# Patient Record
Sex: Female | Born: 1940 | Race: Black or African American | Hispanic: No | Marital: Single | Smoking: Former smoker
Health system: Southern US, Community
[De-identification: ages and names within clinical notes are randomized; demographics above are authoritative.]

## PROBLEM LIST (undated history)

## (undated) DIAGNOSIS — R000 Tachycardia, unspecified: Secondary | ICD-10-CM

## (undated) DIAGNOSIS — M1990 Unspecified osteoarthritis, unspecified site: Secondary | ICD-10-CM

## (undated) DIAGNOSIS — H269 Unspecified cataract: Secondary | ICD-10-CM

## (undated) DIAGNOSIS — I10 Essential (primary) hypertension: Secondary | ICD-10-CM

## (undated) HISTORY — DX: Unspecified cataract: H26.9

## (undated) HISTORY — PX: ABDOMINAL HYSTERECTOMY: SHX81

## (undated) HISTORY — PX: COLONOSCOPY: SHX174

## (undated) HISTORY — PX: TOE FUSION: SHX1070

## (undated) HISTORY — PX: POLYPECTOMY: SHX149

## (undated) HISTORY — DX: Tachycardia, unspecified: R00.0

## (undated) HISTORY — DX: Unspecified osteoarthritis, unspecified site: M19.90

---

## 1999-11-30 ENCOUNTER — Other Ambulatory Visit: Admission: RE | Admit: 1999-11-30 | Discharge: 1999-11-30 | Payer: Self-pay | Admitting: Obstetrics & Gynecology

## 2002-11-25 ENCOUNTER — Ambulatory Visit: Admission: RE | Admit: 2002-11-25 | Discharge: 2002-11-25 | Payer: Self-pay | Admitting: Internal Medicine

## 2003-11-24 ENCOUNTER — Ambulatory Visit: Payer: Self-pay | Admitting: Nurse Practitioner

## 2003-12-01 ENCOUNTER — Ambulatory Visit (HOSPITAL_COMMUNITY): Admission: RE | Admit: 2003-12-01 | Discharge: 2003-12-01 | Payer: Self-pay | Admitting: Family Medicine

## 2003-12-16 ENCOUNTER — Ambulatory Visit: Payer: Self-pay | Admitting: *Deleted

## 2003-12-29 ENCOUNTER — Ambulatory Visit: Payer: Self-pay | Admitting: Nurse Practitioner

## 2004-09-24 ENCOUNTER — Ambulatory Visit: Payer: Self-pay | Admitting: Internal Medicine

## 2004-09-29 ENCOUNTER — Ambulatory Visit: Payer: Self-pay | Admitting: Internal Medicine

## 2004-10-12 ENCOUNTER — Ambulatory Visit: Payer: Self-pay | Admitting: Gastroenterology

## 2004-10-20 ENCOUNTER — Ambulatory Visit: Payer: Self-pay | Admitting: Gastroenterology

## 2005-01-25 ENCOUNTER — Ambulatory Visit (HOSPITAL_COMMUNITY): Admission: RE | Admit: 2005-01-25 | Discharge: 2005-01-25 | Payer: Self-pay | Admitting: Obstetrics & Gynecology

## 2005-11-24 ENCOUNTER — Ambulatory Visit: Payer: Self-pay | Admitting: Internal Medicine

## 2006-01-11 ENCOUNTER — Emergency Department (HOSPITAL_COMMUNITY): Admission: EM | Admit: 2006-01-11 | Discharge: 2006-01-11 | Payer: Self-pay | Admitting: Emergency Medicine

## 2006-01-30 ENCOUNTER — Ambulatory Visit (HOSPITAL_COMMUNITY): Admission: RE | Admit: 2006-01-30 | Discharge: 2006-01-30 | Payer: Self-pay | Admitting: Internal Medicine

## 2006-04-24 ENCOUNTER — Emergency Department (HOSPITAL_COMMUNITY): Admission: EM | Admit: 2006-04-24 | Discharge: 2006-04-24 | Payer: Self-pay | Admitting: Family Medicine

## 2006-05-15 ENCOUNTER — Ambulatory Visit: Payer: Self-pay | Admitting: Internal Medicine

## 2006-05-15 LAB — CONVERTED CEMR LAB
Cholesterol: 190 mg/dL (ref 0–200)
HDL: 45.3 mg/dL (ref >39.0)
LDL Cholesterol: 128 mg/dL — ABNORMAL HIGH (ref 0–99)
Total CHOL/HDL Ratio: 4.2
Triglycerides: 84 mg/dL (ref 0–149)
VLDL: 17 mg/dL (ref 0–40)

## 2006-08-30 ENCOUNTER — Emergency Department (HOSPITAL_COMMUNITY): Admission: EM | Admit: 2006-08-30 | Discharge: 2006-08-30 | Payer: Self-pay | Admitting: Family Medicine

## 2006-11-27 ENCOUNTER — Ambulatory Visit: Payer: Self-pay | Admitting: Internal Medicine

## 2007-01-06 ENCOUNTER — Encounter: Payer: Self-pay | Admitting: *Deleted

## 2007-01-06 DIAGNOSIS — I10 Essential (primary) hypertension: Secondary | ICD-10-CM | POA: Insufficient documentation

## 2007-01-06 DIAGNOSIS — R002 Palpitations: Secondary | ICD-10-CM | POA: Insufficient documentation

## 2007-01-06 DIAGNOSIS — D126 Benign neoplasm of colon, unspecified: Secondary | ICD-10-CM

## 2007-02-23 ENCOUNTER — Ambulatory Visit (HOSPITAL_COMMUNITY): Admission: RE | Admit: 2007-02-23 | Discharge: 2007-02-23 | Payer: Self-pay | Admitting: Internal Medicine

## 2007-10-29 IMAGING — CR DG CHEST 2V
2 series · 2 of 2 positions shown · non-contrast
Comparison: 01/11/06.

CLINICAL DATA: Cough, nausea.
 PA AND LATERAL CHEST ? 2 VIEW ? 04/24/06:

[view not recorded (1 of 2)]
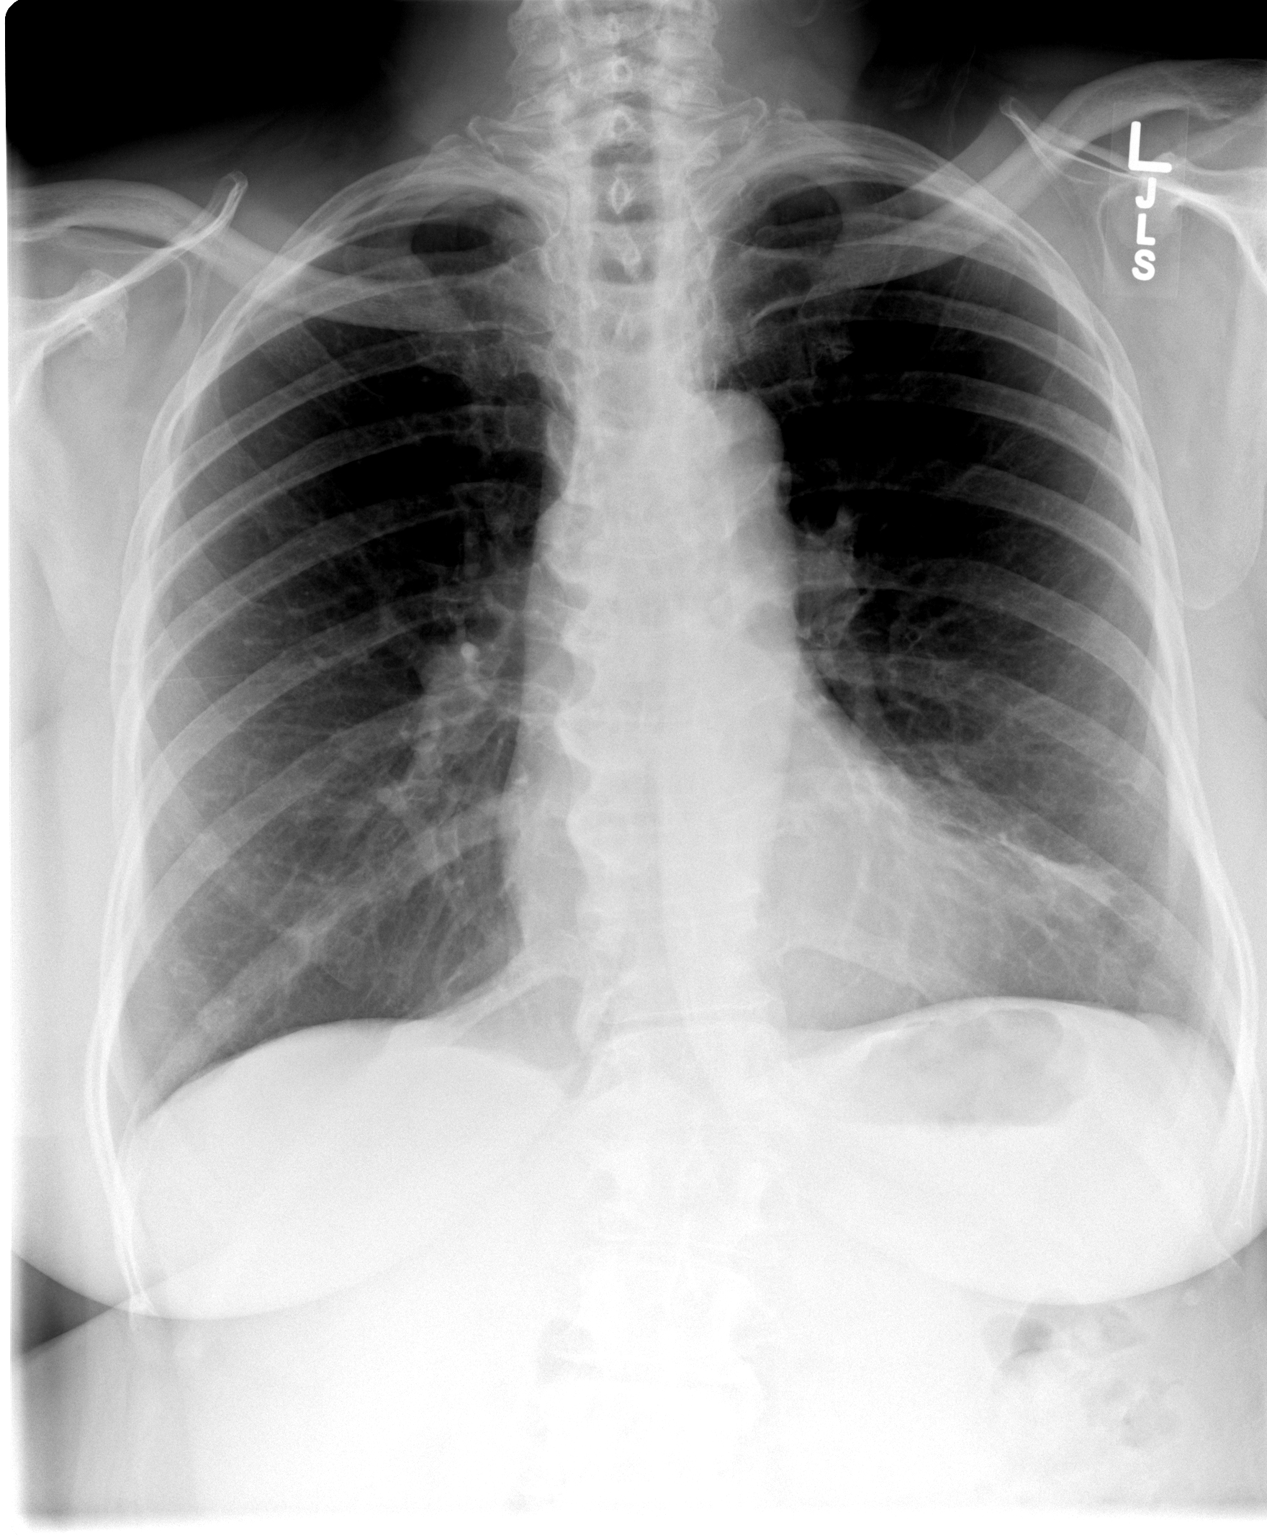

[view not recorded (2 of 2)]
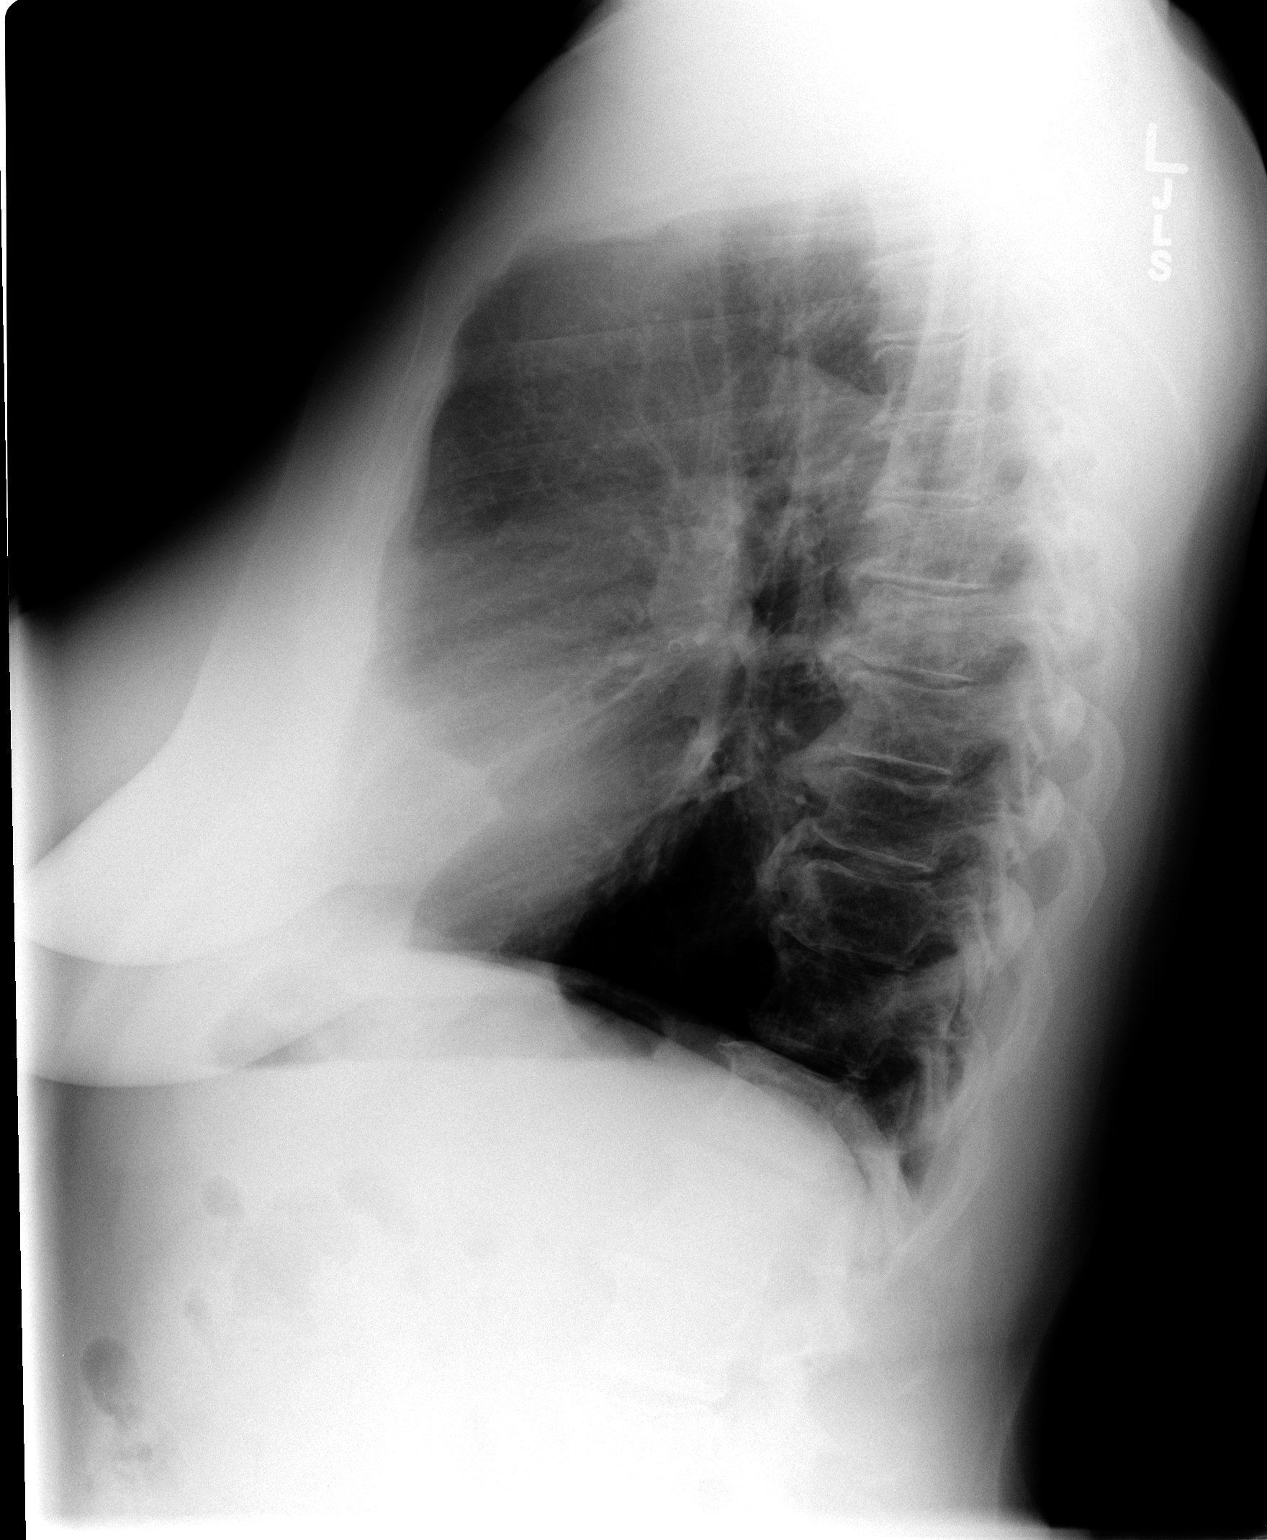

[2 of 2 positions shown; findings below may reference images not displayed]

FINDINGS: There is a new opacity projecting over the left lower lobe, in the approximate area of previous scarring or atelectasis.  Heart size is normal.  The right lung is clear.  No pleural effusion.
IMPRESSION: Pulmonary parenchymal opacity overlying the left lower lobe; given the current clinical history, this could represent developing pneumonia, however, this has a relatively more defined appearance than may be expected for pneumonia, and neoplasm is not excluded.  Follow up to resolution after appropriate treatment in 6-8 weeks is recommended.  If the finding persists at that time, this would be amenable to further evaluation by chest CT.

## 2008-03-11 ENCOUNTER — Ambulatory Visit (HOSPITAL_COMMUNITY): Admission: RE | Admit: 2008-03-11 | Discharge: 2008-03-11 | Payer: Self-pay | Admitting: Internal Medicine

## 2009-03-17 ENCOUNTER — Ambulatory Visit (HOSPITAL_COMMUNITY): Admission: RE | Admit: 2009-03-17 | Discharge: 2009-03-17 | Payer: Self-pay | Admitting: Internal Medicine

## 2009-10-02 ENCOUNTER — Encounter (INDEPENDENT_AMBULATORY_CARE_PROVIDER_SITE_OTHER): Payer: Self-pay | Admitting: *Deleted

## 2009-12-09 ENCOUNTER — Encounter (INDEPENDENT_AMBULATORY_CARE_PROVIDER_SITE_OTHER): Payer: Self-pay | Admitting: *Deleted

## 2009-12-14 ENCOUNTER — Ambulatory Visit: Payer: Self-pay | Admitting: Gastroenterology

## 2010-01-04 ENCOUNTER — Ambulatory Visit: Payer: Self-pay | Admitting: Gastroenterology

## 2010-04-13 NOTE — Letter (Signed)
Summary: Moviprep Instructions  [facility]  [address]   [place], [place] [address]   Phone: [phone]  Fax: [phone]       CATHA ONTKO    [birth year]    MRN: [id number]        Procedure Day /Date: Monday, 01-04-10     Arrival Time: 10:00 a.m.      Procedure Time: 11:00 a.m.     Location of Procedure:                    x  [facility] (4th Floor)                        PREPARATION FOR COLONOSCOPY WITH MOVIPREP   Starting 5 days prior to your procedure 12-30-09 do not eat nuts, seeds, popcorn, corn, beans, peas, salads, or any raw vegetables.  Do not take any fiber supplements (e.g. Metamucil, Citrucel, and Benefiber).  THE DAY BEFORE YOUR PROCEDURE         DATE:  01-03-10 DAY: Sunday  1.  Drink clear liquids the entire day-NO SOLID FOOD  2.  Do not drink anything colored red or purple.  Avoid juices with pulp.  No orange juice.  3.  Drink at least 64 oz. (8 glasses) of fluid/clear liquids during the day to prevent dehydration and help the prep work efficiently.  CLEAR LIQUIDS INCLUDE: Water Jello Ice Popsicles Tea (sugar ok, no milk/cream) Powdered fruit flavored drinks Coffee (sugar ok, no milk/cream) Gatorade Juice: apple, white grape, white cranberry  Lemonade Clear bullion, consomm, broth Carbonated beverages (any kind) Strained chicken noodle soup Hard Candy                             4.  In the morning, mix first dose of MoviPrep solution:    Empty 1 Pouch A and 1 Pouch B into the disposable container    Add lukewarm drinking water to the top line of the container. Mix to dissolve    Refrigerate (mixed solution should be used within 24 hrs)  5.  Begin drinking the prep at 5:00 p.m. The MoviPrep container is divided by 4 marks.   Every 15 minutes drink the solution down to the next mark (approximately 8 oz) until the full liter is complete.   6.  Follow completed prep with 16 oz of clear liquid of your choice  (Nothing red or purple).  Continue to drink clear liquids until bedtime.  7.  Before going to bed, mix second dose of MoviPrep solution:    Empty 1 Pouch A and 1 Pouch B into the disposable container    Add lukewarm drinking water to the top line of the container. Mix to dissolve    Refrigerate  THE DAY OF YOUR PROCEDURE      DATE: 01-04-10 DAY: Monday  Beginning at 6:00 a.m. (5 hours before procedure)         1. Every 15 minutes, drink the solution down to the next mark (approx 8 oz) until the full liter is complete.  2. Follow completed prep with 16 oz. of clear liquid of your choice.    3. You may drink clear liquids until  9:00 a.m. (2 hours before procedure)   MEDICATION INSTRUCTIONS  Unless otherwise instructed, you should take regular prescription medications with a small sip of water   as early as possible the morning of  your procedure.    Additional medication instructions:dO NOT TAKE LISINOPRIL/HCTZ DAY OF PROCEDURE.         OTHER INSTRUCTIONS  You will need a responsible adult at least 71 years of age to accompany you and drive you home.   This person must remain in the waiting room during your procedure.  Wear loose fitting clothing that is easily removed.  Leave jewelry and other valuables at home.  However, you may wish to bring a book to read or  an iPod/MP3 player to listen to music as you wait for your procedure to start.  Remove all body piercing jewelry and leave at home.  Total time from sign-in until discharge is approximately 2-3 hours.  You should go home directly after your procedure and rest.  You can resume normal activities the  day after your procedure.  The day of your procedure you should not:   Drive   Make legal decisions   Operate machinery   Drink alcohol   Return to work  You will receive specific instructions about eating, activities and medications before you leave.    The above instructions have been reviewed and  explained to me by    Ezra Sites RN  October  71, 2011 1:49 PM     I fully understand and can verbalize these instructions _____________________________ Date _________

## 2010-04-13 NOTE — Letter (Signed)
Summary: Colonoscopy Letter  [facility]  [address] [place], [place] [address]   Phone: [phone]  Fax: [phone]      October 02, 2009 MRN: [id number]   AMINAH ZABAWA [address] [place], [place]  [address]   Dear Ms. Daniele,   According to your medical record, it is time for you to schedule a Colonoscopy. The American Cancer Society recommends this procedure as a method to detect early colon cancer. Patients with a family history of colon cancer, or a personal history of colon polyps or inflammatory bowel disease are at increased risk.  This letter has beeen generated based on the recommendations made at the time of your procedure. If you feel that in your particular situation this may no longer apply, please contact our office.  Please call our office at [phone] to schedule this appointment or to update your records at your earliest convenience.  Thank you for cooperating with [place] to provide you with the very best care possible.   Sincerely,    Vania Rea. Jarold Motto, M.D.  [facility] [phone]

## 2010-04-13 NOTE — Miscellaneous (Signed)
Summary: lec pv  Clinical Lists Changes  Medications: Added new medication of MOVIPREP 100 GM  SOLR (PEG-KCL-NACL-NASULF-NA ASC-C) As per prep instructions. - Signed Rx of MOVIPREP 100 GM  SOLR (PEG-KCL-NACL-NASULF-NA ASC-C) As per prep instructions.;  #1 x 0;  Signed;  Entered by: Ezra Sites RN;  Authorized by: Mardella Layman MD [facility];  Method used: Print then Give to Patient Observations: Added new observation of NKA: T (12/14/2009 13:30)    Prescriptions: MOVIPREP 100 GM  SOLR (PEG-KCL-NACL-NASULF-NA ASC-C) As per prep instructions.  #1 x 0   Entered by:   Ezra Sites RN   Authorized by:   Mardella Layman MD [facility]   Signed by:   Ezra Sites RN on 12/14/2009   Method used:   Print then Give to Patient   RxID:   [id number]

## 2010-04-13 NOTE — Procedures (Signed)
Summary: Colonoscopy  Patient: [name] Note: All result statuses are Final unless otherwise noted.  Tests: (1) Colonoscopy (COL)   COL Colonoscopy           DONE     [facility]     [address]     [place], [place]  [address]           COLONOSCOPY PROCEDURE REPORT           PATIENT:  April Tucker, April Tucker  MR#:  [id number]     BIRTHDATE:  [birth year], 69 yrs. old  GENDER:  female     ENDOSCOPIST:  Vania Rea. Jarold Motto, MD, [facility]     REF. BY:     PROCEDURE DATE:  01/04/2010     PROCEDURE:  Diagnostic Colonoscopy     ASA CLASS:  Class II     INDICATIONS:  history of polyps     MEDICATIONS:   Fentanyl 75 mcg IV, Versed 7 mg IV           DESCRIPTION OF PROCEDURE:   After the risks benefits and     alternatives of the procedure were thoroughly explained, informed     consent was obtained.  Digital rectal exam was performed and     revealed no abnormalities.   The LB 180AL [id number] endoscope was     introduced through the anus and advanced to the cecum, which was     identified by both the appendix and ileocecal valve, limited by a     redundant colon.    The quality of the prep was adequate, using     MoviPrep.  The instrument was then slowly withdrawn as the colon     was fully examined.     <<PROCEDUREIMAGES>>           FINDINGS:  Severe diverticulosis was found in the sigmoid to     descending colon segments.  Mild diverticulosis was found in the     right colon.  No polyps or cancers were seen.   Retroflexed views     in the rectum revealed no abnormalities.    The scope was then     withdrawn from the patient and the procedure completed.           COMPLICATIONS:  None     ENDOSCOPIC IMPRESSION:     1) Severe diverticulosis in the sigmoid to descending colon     segments     2) Mild diverticulosis in the right colon     3) No polyps or cancers     RECOMMENDATIONS:     1) high fiber diet     2) Repeat Colonoscopy in 5 years.     REPEAT EXAM:  No        ______________________________     Vania Rea. Jarold Motto, MD, [facility]           CC:  Kellie Shropshire, MD           n.     Rosalie Doctor:   Vania Rea. Patterson at 01/04/2010 11:28 AM           Wyatt Mage, [id number]  Note: An exclamation mark (!) indicates a result that was not dispersed into the flowsheet. Document Creation Date: 01/04/2010 11:28 AM _______________________________________________________________________  (1) Order result status: Final Collection or observation date-time: 01/04/2010 11:19 Requested date-time:  Receipt date-time:  Reported date-time:  Referring Physician:   Ordering Physician: Sheryn Bison [phone]) Specimen Source:  Source: EndoProS  Filler Order Number: [id number] Lab site:   Appended Document: Colonoscopy    Clinical Lists Changes  Observations: Added new observation of COLONNXTDUE: 12/2014 (01/04/2010 13:48)

## 2010-05-21 ENCOUNTER — Other Ambulatory Visit (HOSPITAL_COMMUNITY): Payer: Self-pay | Admitting: Internal Medicine

## 2010-05-21 DIAGNOSIS — Z1231 Encounter for screening mammogram for malignant neoplasm of breast: Secondary | ICD-10-CM

## 2010-06-07 ENCOUNTER — Ambulatory Visit (HOSPITAL_COMMUNITY): Payer: MEDICARE

## 2010-06-15 ENCOUNTER — Ambulatory Visit (HOSPITAL_COMMUNITY)
Admission: RE | Admit: 2010-06-15 | Discharge: 2010-06-15 | Disposition: A | Discharge status: Home / Self Care | Payer: MEDICARE | Source: Ambulatory Visit | Attending: Internal Medicine | Admitting: Internal Medicine

## 2010-06-15 DIAGNOSIS — Z1231 Encounter for screening mammogram for malignant neoplasm of breast: Secondary | ICD-10-CM | POA: Insufficient documentation

## 2010-12-29 LAB — I-STAT 8, (EC8 V) (CONVERTED LAB)
Acid-Base Excess: 2
BUN: 25 — ABNORMAL HIGH
Bicarbonate: 28.6 — ABNORMAL HIGH
Chloride: 104
Glucose, Bld: 91
HCT: 39
Hemoglobin: 13.3
Operator id: 239701
Potassium: 4.5
Sodium: 139
TCO2: 30
pCO2, Ven: 50.5 — ABNORMAL HIGH
pH, Ven: 7.361 — ABNORMAL HIGH

## 2010-12-29 LAB — POCT RAPID STREP A: Streptococcus, Group A Screen (Direct): NEGATIVE

## 2010-12-29 LAB — H. PYLORI ANTIBODY, IGG: H Pylori IgG: 1.4 — ABNORMAL HIGH

## 2011-08-02 ENCOUNTER — Other Ambulatory Visit (HOSPITAL_COMMUNITY): Payer: Self-pay | Admitting: Internal Medicine

## 2011-08-02 DIAGNOSIS — Z1231 Encounter for screening mammogram for malignant neoplasm of breast: Secondary | ICD-10-CM

## 2011-08-23 ENCOUNTER — Ambulatory Visit (HOSPITAL_COMMUNITY)
Admission: RE | Admit: 2011-08-23 | Discharge: 2011-08-23 | Disposition: A | Discharge status: Home / Self Care | Payer: Medicare Other | Source: Ambulatory Visit | Attending: Internal Medicine | Admitting: Internal Medicine

## 2011-08-23 DIAGNOSIS — Z1231 Encounter for screening mammogram for malignant neoplasm of breast: Secondary | ICD-10-CM

## 2012-03-09 ENCOUNTER — Emergency Department (INDEPENDENT_AMBULATORY_CARE_PROVIDER_SITE_OTHER): Payer: Medicare Other

## 2012-03-09 ENCOUNTER — Encounter (HOSPITAL_COMMUNITY): Payer: Self-pay | Admitting: Emergency Medicine

## 2012-03-09 ENCOUNTER — Emergency Department (HOSPITAL_COMMUNITY)
Admission: EM | Admit: 2012-03-09 | Discharge: 2012-03-09 | Disposition: A | Discharge status: Home / Self Care | Payer: Self-pay | Source: Home / Self Care | Attending: Emergency Medicine | Admitting: Emergency Medicine

## 2012-03-09 DIAGNOSIS — S40019A Contusion of unspecified shoulder, initial encounter: Secondary | ICD-10-CM

## 2012-03-09 DIAGNOSIS — W108XXA Fall (on) (from) other stairs and steps, initial encounter: Secondary | ICD-10-CM

## 2012-03-09 DIAGNOSIS — S8000XA Contusion of unspecified knee, initial encounter: Secondary | ICD-10-CM

## 2012-03-09 DIAGNOSIS — S5000XA Contusion of unspecified elbow, initial encounter: Secondary | ICD-10-CM

## 2012-03-09 HISTORY — DX: Essential (primary) hypertension: I10

## 2012-03-09 MED ORDER — HYDROCODONE-ACETAMINOPHEN 5-325 MG PO TABS: 2.0000 | ORAL_TABLET | ORAL | Status: DC | PRN

## 2012-03-09 MED ORDER — MELOXICAM 7.5 MG PO TABS: 7.5000 mg | ORAL_TABLET | Freq: Every day | ORAL | Status: AC

## 2012-03-09 NOTE — ED Notes (Signed)
Pt states that last Thursday she fell down 8 steps. Pt is c/o of left knee pain, bilateral shoulder pain and neck stiffness. Pt states most pain felt in knee upon standing. Pt has used icey hott and rubbing alcohol with mild relief of pain .

## 2012-03-09 NOTE — ED Provider Notes (Signed)
A lot better look a History     CSN: [id number]  Arrival date & time 03/09/12  1247   First MD Initiated Contact with Patient 03/09/12 1445      Chief Complaint  Patient presents with  . Fall    left knee pain, bilateral shoulder pain and neck stiffness    (Consider location/radiation/quality/duration/timing/severity/associated sxs/prior treatment) HPI Comments: Pt states that last Thursday she fell down 8 steps, hitting her Left elbow, and Knee with hurting of her R elbow" he has been sore since then.Marland Kitchen able to walk and to flex and extend her right elbow. " I don't think have anything fracture but my knee is really hurting me when I walk or bend my knee"...  Patient is a 71 y.o. female presenting with fall. The history is provided by the patient.  Fall The accident occurred more than 1 week ago. The fall occurred from a ladder. She fell from a height of 3 to 5 ft. She landed on a hard floor. There was no blood loss. The point of impact was the left shoulder, left knee and right elbow. The pain is at a severity of 6/10. The patient is experiencing no pain. There was no entrapment after the fall. Pertinent negatives include no fever, no abdominal pain, no headaches, no hearing loss, no loss of consciousness and no tingling. The symptoms are aggravated by activity. She has tried nothing for the symptoms. The treatment provided no relief.    Past Medical History  Diagnosis Date  . Hypertension     Past Surgical History  Procedure Date  . Abdominal hysterectomy     History reviewed. No pertinent family history.  History  Substance Use Topics  . Smoking status: Never Smoker   . Smokeless tobacco: Not on file  . Alcohol Use: No    OB History    Grav Para Term Preterm Abortions TAB SAB Ect Mult Living                  Review of Systems  Constitutional: Negative for fever, chills, diaphoresis, activity change, appetite change and fatigue.  Gastrointestinal: Negative for  abdominal pain.  Musculoskeletal: Positive for arthralgias. Negative for myalgias, back pain and gait problem.  Neurological: Negative for tingling, loss of consciousness and headaches.    Allergies  Review of patient's allergies indicates no known allergies.  Home Medications   Current Outpatient Rx  Name  Route  Sig  Dispense  Refill  . LISINOPRIL 2.5 MG PO TABS   Oral   Take 2.5 mg by mouth daily.           BP 153/97  Pulse 77  Temp 97.9 F (36.6 C) (Oral)  Resp 20  SpO2 97%  Physical Exam  Constitutional: Vital signs are normal. She appears well-developed and well-nourished.  Non-toxic appearance. She does not have a sickly appearance. She does not appear ill. No distress.  HENT:  Head: Normocephalic.  Mouth/Throat: No oropharyngeal exudate.  Eyes: Conjunctivae normal are normal.  Musculoskeletal:       Arms:      Legs: Neurological: She is alert.  Skin: No rash noted. No erythema.    ED Course  Procedures (including critical care time)  Labs Reviewed - No data to display Dg Shoulder Left  03/09/2012  *RADIOLOGY REPORT*  Clinical Data: Pain post fall.  LEFT SHOULDER - 2+ VIEW  Comparison: None.  Findings: Marginal spurs from the humeral head and narrowing of the cartilage at the  glenohumeral articulation.  There is some sclerosis and subchondral cysts or geodes also evident portions of the humeral head.  Negative for fracture, dislocation, or other acute bone injury.  IMPRESSION:  1.  Negative for fracture or other acute bony abnormality. 2.  Glenohumeral degenerative changes as above.   Original Report Authenticated By: D. Andria Rhein, MD    Dg Knee Complete 4 Views Left  03/09/2012  *RADIOLOGY REPORT*  Clinical Data: History of injury from fall 8 days previously.  Pain in the anteromedial aspect.  Soft tissue swelling.  LEFT KNEE - COMPLETE 4+ VIEW  Comparison: None.  Findings: No definite joint effusion is evident.  There is narrowing of the patellofemoral  joint space in the medial joint space.  There is marginal osteophyte formation representing degenerative joint osteoarthritic changes.  No fracture or dislocation is evident.  There is osteopenic appearance of bones.  IMPRESSION: Narrowing of patellofemoral joint space and medial joint space. 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  No fracture or dislocation. Osteopenic appearance of bones.   Original Report Authenticated By: Onalee Hua Call      No diagnosis found.    MDM  Multiple contusions. Status post fall 8 days ago. With contusions to her left shoulder left knee and right elbow. Performing x-rays of her knee and shoulder patient has more bony tenderness on exam. Right elbow with full range of motion. No bony tenderness on exam. Patient will be prescribed NSAID  or 10 tablets will.        April Molly, MD 03/09/12 1539

## 2012-09-11 ENCOUNTER — Other Ambulatory Visit (HOSPITAL_COMMUNITY): Payer: Self-pay | Admitting: Internal Medicine

## 2012-09-11 DIAGNOSIS — Z1231 Encounter for screening mammogram for malignant neoplasm of breast: Secondary | ICD-10-CM

## 2012-09-18 ENCOUNTER — Ambulatory Visit (HOSPITAL_COMMUNITY)
Admission: RE | Admit: 2012-09-18 | Discharge: 2012-09-18 | Disposition: A | Discharge status: Home / Self Care | Payer: Medicare Other | Source: Ambulatory Visit | Attending: Internal Medicine | Admitting: Internal Medicine

## 2012-09-18 DIAGNOSIS — Z1231 Encounter for screening mammogram for malignant neoplasm of breast: Secondary | ICD-10-CM | POA: Insufficient documentation

## 2012-12-24 ENCOUNTER — Ambulatory Visit (INDEPENDENT_AMBULATORY_CARE_PROVIDER_SITE_OTHER): Payer: Medicare Other

## 2012-12-24 ENCOUNTER — Encounter: Payer: Self-pay | Admitting: Podiatry

## 2012-12-24 ENCOUNTER — Ambulatory Visit (INDEPENDENT_AMBULATORY_CARE_PROVIDER_SITE_OTHER): Payer: Medicare Other | Admitting: Podiatry

## 2012-12-24 VITALS — BP 139/78 | HR 88 | Resp 16

## 2012-12-24 DIAGNOSIS — Z9889 Other specified postprocedural states: Secondary | ICD-10-CM

## 2012-12-24 DIAGNOSIS — M2040 Other hammer toe(s) (acquired), unspecified foot: Secondary | ICD-10-CM

## 2012-12-24 NOTE — Progress Notes (Signed)
Subjective:     Patient ID: April Tucker, female   DOB: [birth year], 72 y.o.   MRN: [id number]  HPI patient states I'm doing fine and I am able to walk around as much as I feel at this time   Review of Systems  All other systems reviewed and are negative.       Objective:   Physical Exam  Nursing note and vitals reviewed. Cardiovascular: Intact distal pulses.   Musculoskeletal: Normal range of motion.  Neurological: She is alert.  Skin: Skin is warm.   pins are intact second third fourth toes left foot with excellent alignment     Assessment:     Healing well from digital surgery digits 234 left.    Plan:     Pin removal accomplished with sterile dressings applied to the end of the toe. Reviewed x-rays instructed on gradual return to normal shoe gear reappoint 6 weeks

## 2013-02-04 ENCOUNTER — Ambulatory Visit (INDEPENDENT_AMBULATORY_CARE_PROVIDER_SITE_OTHER): Payer: Medicare Other | Admitting: Podiatry

## 2013-02-04 ENCOUNTER — Encounter: Payer: Self-pay | Admitting: Podiatry

## 2013-02-04 ENCOUNTER — Ambulatory Visit (INDEPENDENT_AMBULATORY_CARE_PROVIDER_SITE_OTHER): Payer: Medicare Other

## 2013-02-04 VITALS — BP 138/78 | HR 79 | Resp 12

## 2013-02-04 DIAGNOSIS — Z9889 Other specified postprocedural states: Secondary | ICD-10-CM

## 2013-02-04 DIAGNOSIS — M2040 Other hammer toe(s) (acquired), unspecified foot: Secondary | ICD-10-CM

## 2013-02-04 NOTE — Progress Notes (Signed)
Subjective:     Patient ID: April Tucker, female   DOB: 11-30-1940, 72 y.o.   MRN: [id number]  HPI patient's left second and third toe are found to be in good alignment with no pain when pressed. 3 months post surgery   Review of Systems     Objective:   Physical Exam Neurovascular status intact with good structural alignment second and third toe    Assessment:     Doing well postoperatively    Plan:     Reviewed x-rays and at this time discharged with instructions on plantar flexing and dorsiflexing the second toe for maximum motion

## 2013-09-16 ENCOUNTER — Other Ambulatory Visit (HOSPITAL_COMMUNITY): Payer: Self-pay | Admitting: Internal Medicine

## 2013-09-16 DIAGNOSIS — Z1231 Encounter for screening mammogram for malignant neoplasm of breast: Secondary | ICD-10-CM

## 2013-09-26 ENCOUNTER — Ambulatory Visit (HOSPITAL_COMMUNITY)
Admission: RE | Admit: 2013-09-26 | Discharge: 2013-09-26 | Disposition: A | Discharge status: Home / Self Care | Payer: Medicare Other | Source: Ambulatory Visit | Attending: Internal Medicine | Admitting: Internal Medicine

## 2013-09-26 DIAGNOSIS — Z1231 Encounter for screening mammogram for malignant neoplasm of breast: Secondary | ICD-10-CM

## 2014-04-17 ENCOUNTER — Other Ambulatory Visit: Payer: Self-pay | Admitting: Internal Medicine

## 2014-04-17 DIAGNOSIS — Z13220 Encounter for screening for lipoid disorders: Secondary | ICD-10-CM | POA: Diagnosis not present

## 2014-04-17 DIAGNOSIS — R7301 Impaired fasting glucose: Secondary | ICD-10-CM | POA: Diagnosis not present

## 2014-04-17 DIAGNOSIS — D120 Benign neoplasm of cecum: Secondary | ICD-10-CM | POA: Diagnosis not present

## 2014-04-17 DIAGNOSIS — M810 Age-related osteoporosis without current pathological fracture: Secondary | ICD-10-CM

## 2014-04-17 DIAGNOSIS — I10 Essential (primary) hypertension: Secondary | ICD-10-CM | POA: Diagnosis not present

## 2014-04-17 DIAGNOSIS — M129 Arthropathy, unspecified: Secondary | ICD-10-CM | POA: Diagnosis not present

## 2014-04-28 ENCOUNTER — Other Ambulatory Visit: Payer: Self-pay

## 2014-05-05 ENCOUNTER — Ambulatory Visit
Admission: RE | Admit: 2014-05-05 | Discharge: 2014-05-05 | Disposition: A | Discharge status: Home / Self Care | Payer: Medicare Other | Source: Ambulatory Visit | Attending: Internal Medicine | Admitting: Internal Medicine

## 2014-05-05 DIAGNOSIS — M810 Age-related osteoporosis without current pathological fracture: Secondary | ICD-10-CM

## 2014-05-05 DIAGNOSIS — Z780 Asymptomatic menopausal state: Secondary | ICD-10-CM | POA: Diagnosis not present

## 2014-05-05 DIAGNOSIS — Z13820 Encounter for screening for osteoporosis: Secondary | ICD-10-CM | POA: Diagnosis not present

## 2014-09-26 ENCOUNTER — Other Ambulatory Visit (HOSPITAL_COMMUNITY): Payer: Self-pay | Admitting: Internal Medicine

## 2014-09-26 DIAGNOSIS — Z1231 Encounter for screening mammogram for malignant neoplasm of breast: Secondary | ICD-10-CM

## 2014-10-08 ENCOUNTER — Ambulatory Visit (HOSPITAL_COMMUNITY): Payer: Medicare Other

## 2014-10-15 ENCOUNTER — Ambulatory Visit (HOSPITAL_COMMUNITY)
Admission: RE | Admit: 2014-10-15 | Discharge: 2014-10-15 | Disposition: A | Discharge status: Home / Self Care | Payer: Medicare Other | Source: Ambulatory Visit | Attending: Internal Medicine | Admitting: Internal Medicine

## 2014-10-15 DIAGNOSIS — Z1231 Encounter for screening mammogram for malignant neoplasm of breast: Secondary | ICD-10-CM | POA: Diagnosis present

## 2014-11-09 ENCOUNTER — Encounter: Payer: Self-pay | Admitting: Internal Medicine

## 2014-12-15 ENCOUNTER — Encounter: Payer: Self-pay | Admitting: Internal Medicine

## 2015-01-28 ENCOUNTER — Encounter: Payer: Self-pay | Admitting: Gastroenterology

## 2015-02-02 ENCOUNTER — Ambulatory Visit (AMBULATORY_SURGERY_CENTER): Payer: Self-pay | Admitting: *Deleted

## 2015-02-02 VITALS — Ht 66.0 in | Wt 218.0 lb

## 2015-02-02 DIAGNOSIS — Z86010 Personal history of colonic polyps: Secondary | ICD-10-CM

## 2015-02-02 MED ORDER — NA SULFATE-K SULFATE-MG SULF 17.5-3.13-1.6 GM/177ML PO SOLN: 1.0000 | Freq: Once | ORAL | Status: DC

## 2015-02-02 NOTE — Progress Notes (Signed)
No egg or soy allergy No issues with past sedation No diet pills No home 02 use   No e mail

## 2015-02-03 ENCOUNTER — Encounter: Payer: Self-pay | Admitting: Internal Medicine

## 2015-02-16 ENCOUNTER — Ambulatory Visit (AMBULATORY_SURGERY_CENTER): Payer: Medicare Other | Admitting: Internal Medicine

## 2015-02-16 ENCOUNTER — Encounter: Payer: Self-pay | Admitting: Internal Medicine

## 2015-02-16 VITALS — BP 128/78 | HR 66 | Temp 96.6°F | Resp 14 | Ht 66.0 in | Wt 218.0 lb

## 2015-02-16 DIAGNOSIS — D127 Benign neoplasm of rectosigmoid junction: Secondary | ICD-10-CM

## 2015-02-16 DIAGNOSIS — Z86010 Personal history of colonic polyps: Secondary | ICD-10-CM | POA: Diagnosis not present

## 2015-02-16 DIAGNOSIS — D124 Benign neoplasm of descending colon: Secondary | ICD-10-CM | POA: Diagnosis not present

## 2015-02-16 DIAGNOSIS — D125 Benign neoplasm of sigmoid colon: Secondary | ICD-10-CM | POA: Diagnosis not present

## 2015-02-16 MED ORDER — SODIUM CHLORIDE 0.9 % IV SOLN: 500.0000 mL | INTRAVENOUS | Status: DC

## 2015-02-16 NOTE — Progress Notes (Signed)
Called to room to assist during endoscopic procedure.  Patient ID and intended procedure confirmed with present staff. Received instructions for my participation in the procedure from the performing physician.  

## 2015-02-16 NOTE — Op Note (Signed)
[facility]  [address] [place], [address]   COLONOSCOPY PROCEDURE REPORT  PATIENT: April Tucker, April Tucker  MR#: [id number] BIRTHDATE: November 14, 1940 , 31  yrs. old GENDER: female ENDOSCOPIST: Jerene Bears, MD PROCEDURE DATE:  02/16/2015 PROCEDURE:   Colonoscopy, surveillance and Colonoscopy with snare polypectomy First Screening Colonoscopy - Avg.  risk and is 50 yrs.  old or older - No.  Prior Negative Screening - Now for repeat screening. N/A  History of Adenoma - Now for follow-up colonoscopy & has been > or = to 3 yrs.  Yes hx of adenoma.  Has been 3 or more years since last colonoscopy.  Polyps removed today? Yes ASA CLASS:   Class III INDICATIONS:Surveillance due to prior colonic neoplasia and PH Colon Adenoma. MEDICATIONS: Monitored anesthesia care and Propofol 260 mg IV  DESCRIPTION OF PROCEDURE:   After the risks benefits and alternatives of the procedure were thoroughly explained, informed consent was obtained.  The digital rectal exam revealed no rectal mass.   The LB [id number]  endoscope was introduced through the anus and advanced to the cecum, which was identified by both the appendix and ileocecal valve. No adverse events experienced. The quality of the prep was good.  (Suprep was used)  The instrument was then slowly withdrawn as the colon was fully examined. Estimated blood loss is zero unless otherwise noted in this procedure report.  COLON FINDINGS: A sessile polyp measuring 4 mm in size was found in the descending colon.  A polypectomy was performed with a cold snare.  The resection was complete, the polyp tissue was completely retrieved and sent to histology.   Three sessile polyps ranging between 3-22mm in size were found in the rectosigmoid colon. Polypectomies were performed with a cold snare.  The resection was complete, the polyp tissue was completely retrieved and sent to histology.   There was severe diverticulosis noted in the  ascending colon and left colon.  Retroflexed views revealed small internal hemorrhoids. The time to cecum = 4.8 Withdrawal time = 12.9   The scope was withdrawn and the procedure completed. COMPLICATIONS: There were no immediate complications.  ENDOSCOPIC IMPRESSION: 1.   Sessile polyp was found in the descending colon; polypectomy was performed with a cold snare 2.   Three sessile polyps ranging between 3-62mm in size were found in the rectosigmoid colon; polypectomies were performed with a cold snare 3.   Severe diverticulosis was noted in the ascending colon and left colon  RECOMMENDATIONS: 1.  Await pathology results 2.  High fiber diet 3.  Timing of repeat colonoscopy will be determined by pathology findings. 4.  You will receive a letter within 1-2 weeks with the results of your biopsy as well as final recommendations.  Please call my office if you have not received a letter after 3 weeks.  eSigned:  Jerene Bears, MD 02/16/2015 11:15 AM  cc: Nolene Ebbs, MD and The Patient

## 2015-02-16 NOTE — Patient Instructions (Signed)
Discharge instructions given. Handouts on polyps and diverticulosis. Resume previous medications. YOU HAD AN ENDOSCOPIC PROCEDURE TODAY AT THE [facility]:   Refer to the procedure report that was given to you for any specific questions about what was found during the examination.  If the procedure report does not answer your questions, please call your gastroenterologist to clarify.  If you requested that your care partner not be given the details of your procedure findings, then the procedure report has been included in a sealed envelope for you to review at your convenience later.  YOU SHOULD EXPECT: Some feelings of bloating in the abdomen. Passage of more gas than usual.  Walking can help get rid of the air that was put into your GI tract during the procedure and reduce the bloating. If you had a lower endoscopy (such as a colonoscopy or flexible sigmoidoscopy) you may notice spotting of blood in your stool or on the toilet paper. If you underwent a bowel prep for your procedure, you may not have a normal bowel movement for a few days.  Please Note:  You might notice some irritation and congestion in your nose or some drainage.  This is from the oxygen used during your procedure.  There is no need for concern and it should clear up in a day or so.  SYMPTOMS TO REPORT IMMEDIATELY:   Following lower endoscopy (colonoscopy or flexible sigmoidoscopy):  Excessive amounts of blood in the stool  Significant tenderness or worsening of abdominal pains  Swelling of the abdomen that is new, acute  Fever of 100F or higher   For urgent or emergent issues, a gastroenterologist can be reached at any hour by calling [phone].   DIET: Your first meal following the procedure should be a small meal and then it is ok to progress to your normal diet. Heavy or fried foods are harder to digest and may make you feel nauseous or bloated.  Likewise, meals heavy in dairy and vegetables can  increase bloating.  Drink plenty of fluids but you should avoid alcoholic beverages for 24 hours.  ACTIVITY:  You should plan to take it easy for the rest of today and you should NOT DRIVE or use heavy machinery until tomorrow (because of the sedation medicines used during the test).    FOLLOW UP: Our staff will call the number listed on your records the next business day following your procedure to check on you and address any questions or concerns that you may have regarding the information given to you following your procedure. If we do not reach you, we will leave a message.  However, if you are feeling well and you are not experiencing any problems, there is no need to return our call.  We will assume that you have returned to your regular daily activities without incident.  If any biopsies were taken you will be contacted by phone or by letter within the next 1-3 weeks.  Please call us at [phone] if you have not heard about the biopsies in 3 weeks.    SIGNATURES/CONFIDENTIALITY: You and/or your care partner have signed paperwork which will be entered into your electronic medical record.  These signatures attest to the fact that that the information above on your After Visit Summary has been reviewed and is understood.  Full responsibility of the confidentiality of this discharge information lies with you and/or your care-partner. 

## 2015-02-16 NOTE — Progress Notes (Signed)
To recovery, report given and VSS. 

## 2015-02-17 ENCOUNTER — Telehealth: Payer: Self-pay | Admitting: *Deleted

## 2015-02-17 NOTE — Telephone Encounter (Signed)
  Follow up Call-  Call back number 02/16/2015  Post procedure Call Back phone  # [phone]  Permission to leave phone message Yes     Patient questions:  Do you have a fever, pain , or abdominal swelling? No. Pain Score  0 *  Have you tolerated food without any problems? Yes.    Have you been able to return to your normal activities? Yes.    Do you have any questions about your discharge instructions: Diet   No. Medications  No. Follow up visit  No.  Do you have questions or concerns about your Care? No.  Actions: * If pain score is 4 or above: No action needed, pain <4.

## 2015-02-20 ENCOUNTER — Encounter: Payer: Self-pay | Admitting: Gastroenterology

## 2015-02-20 ENCOUNTER — Encounter: Payer: Self-pay | Admitting: Internal Medicine

## 2015-09-09 ENCOUNTER — Encounter (HOSPITAL_COMMUNITY): Payer: Self-pay | Admitting: Emergency Medicine

## 2015-09-09 ENCOUNTER — Ambulatory Visit (HOSPITAL_COMMUNITY)
Admission: EM | Admit: 2015-09-09 | Discharge: 2015-09-09 | Disposition: A | Discharge status: Home / Self Care | Payer: Medicare Other | Attending: Family Medicine | Admitting: Family Medicine

## 2015-09-09 DIAGNOSIS — N390 Urinary tract infection, site not specified: Secondary | ICD-10-CM

## 2015-09-09 LAB — POCT URINALYSIS DIP (DEVICE)
BILIRUBIN URINE: NEGATIVE
Glucose, UA: NEGATIVE mg/dL
Ketones, ur: NEGATIVE mg/dL
Nitrite: POSITIVE — AB
PROTEIN: NEGATIVE mg/dL
Specific Gravity, Urine: 1.02 (ref 1.005–1.030)
Urobilinogen, UA: 0.2 mg/dL (ref 0.0–1.0)
pH: 5.5 (ref 5.0–8.0)

## 2015-09-09 MED ORDER — CIPROFLOXACIN HCL 500 MG PO TABS: 500.0000 mg | ORAL_TABLET | Freq: Two times a day (BID) | ORAL | Status: DC

## 2015-09-09 NOTE — Discharge Instructions (Signed)
Antibiotic Medicine °Antibiotic medicines are used to treat infections caused by bacteria. They work by injuring or killing the bacteria that is making you sick. °HOW IS AN ANTIBIOTIC CHOSEN? °An antibiotic is chosen based on many factors. To help your health care provider choose one for you, tell your health care provider if: °· You have any allergies. °· You are pregnant or plan to get pregnant. °· You are breastfeeding. °· You are taking any medicines. These include over-the-counter medicines, prescription medicines, and herbal remedies. °· You have a medical condition or problem you have not already discussed. °Your health care provider will also consider: °· How often the medicine has to be taken. °· Common side effects of the medicine. °· The cost of the medicine. °· The taste of the medicine. °If you have questions about why an antibiotic was chosen, make sure to ask. °FOR HOW LONG SHOULD I TAKE MY ANTIBIOTIC? °Continue to take your antibiotic for as long as told by your health care provider. Do not stop taking it when you feel better. If you stop taking it too soon: °· You may start to feel sick again. °· Your infection may become harder to treat. °· Complications may develop. °WHAT IF I MISS A DOSE? °Try not to miss any doses of medicine. If you miss a dose, take it as soon as possible. However, if it is almost time for the next dose: °· If you are taking 2 doses per day, take the missed dose and the next dose 5 to 6 hours apart. °· If you are taking 3 or more doses per day, take the missed dose and the next dose 2 to 4 hours apart, then go back to the normal schedule. °If you cannot make up a missed dose, take the next scheduled dose on time. Then take the missed dose after you have taken all the doses as recommended by your health care provider, as if you had one more dose left. °DO ANTIBIOTICS AFFECT BIRTH CONTROL? °Birth control pills may not work while you are on antibiotics. If you are taking birth  control pills, continue taking them as usual and use a second form of birth control, such as a condom, to avoid unwanted pregnancy. Continue using the second form of birth control until you are finished with your current 1 month cycle of birth control pills. °OTHER INFORMATION °· If there is any medicine left over, throw it away. °· Never take someone else's antibiotics. °· Never take leftover antibiotics. °SEEK MEDICAL CARE IF: °· You get worse. °· You do not feel better within a few days of starting the antibiotic medicine. °· You vomit. °· White patches appear in your mouth. °· You have new joint pain that begins after starting the antibiotic. °· You have new muscle aches that begin after starting the antibiotic. °· You had a fever before starting the antibiotic and it returns. °· You have any symptoms of an allergic reaction, such as an itchy rash. If this happens, stop taking the antibiotic. °SEEK IMMEDIATE MEDICAL CARE IF: °· Your urine turns dark or becomes blood-colored. °· Your skin turns yellow. °· You bruise or bleed easily. °· You have severe diarrhea and abdominal cramps. °· You have a severe headache. °· You have signs of a severe allergic reaction, such as: °¨ Trouble breathing. °¨ Wheezing. °¨ Swelling of the lips, tongue, or face. °¨ Fainting. °¨ Blisters on the skin or in the mouth. °If you have signs of a severe allergic   reaction, stop taking the antibiotic right away. °  °This information is not intended to replace advice given to you by your health care provider. Make sure you discuss any questions you have with your health care provider. °  °Document Released: 11/11/2003 Document Revised: 11/19/2014 Document Reviewed: 07/16/2014 °Elsevier Interactive Patient Education ©2016 [employer] ° °

## 2015-09-09 NOTE — ED Notes (Signed)
The patient presented to the [facility] with a complaint of urinary frequency and pressure x 2 weeks.

## 2015-09-09 NOTE — ED Provider Notes (Signed)
CSN: [id number]     Arrival date & time 09/09/15  1655 History   None    Chief Complaint  Patient presents with  . Urinary Frequency   (Consider location/radiation/quality/duration/timing/severity/associated sxs/prior Treatment) Patient is a 75 y.o. female presenting with dysuria.  Dysuria Pain quality:  Aching Pain severity:  Moderate Onset quality:  Sudden Duration:  2 days Timing:  Constant Progression:  Waxing and waning Chronicity:  Recurrent Recent urinary tract infections: no   Relieved by:  Nothing Ineffective treatments:  None tried   Past Medical History  Diagnosis Date  . Hypertension   . Arthritis   . Cataract   . Heart rate fast     hx of this, not current   Past Surgical History  Procedure Laterality Date  . Abdominal hysterectomy    . Toe fusion    . Colonoscopy    . Polypectomy     Family History  Problem Relation Age of Onset  . Colon cancer Neg Hx   . Colon polyps Neg Hx   . Esophageal cancer Neg Hx   . Rectal cancer Neg Hx   . Stomach cancer Neg Hx    Social History  Substance Use Topics  . Smoking status: Former Research scientist (life sciences)  . Smokeless tobacco: Never Used  . Alcohol Use: 0.0 oz/week    0 Standard drinks or equivalent per week     Comment: occ beer and wine    OB History    No data available     Review of Systems  Constitutional: Negative.   HENT: Negative.   Eyes: Negative.   Respiratory: Negative.   Cardiovascular: Negative.   Endocrine: Negative.   Genitourinary: Positive for dysuria.  Skin: Negative.   Allergic/Immunologic: Negative.   Hematological: Negative.   Psychiatric/Behavioral: Negative.     Allergies  Review of patient's allergies indicates no known allergies.  Home Medications   Prior to Admission medications   Medication Sig Start Date End Date Taking? Authorizing Provider  aspirin 81 MG chewable tablet Chew 81 mg by mouth daily.   Yes Historical Provider, MD  lisinopril-hydrochlorothiazide  (PRINZIDE,ZESTORETIC) 20-25 MG per tablet Take 1 tablet by mouth daily.  12/30/12  Yes Historical Provider, MD  meloxicam (MOBIC) 15 MG tablet Take 15 mg by mouth daily.  01/25/13  Yes Historical Provider, MD  Cyanocobalamin (VITAMIN B 12 PO) Take 2 capsules by mouth daily.    Historical Provider, MD  Multiple Vitamin (MULTIVITAMIN) tablet Take 1 tablet by mouth daily.    Historical Provider, MD   Meds Ordered and Administered this Visit  Medications - No data to display  BP 135/80 mmHg  Pulse 100  Temp(Src) 98.1 F (36.7 C) (Oral)  Resp 18  SpO2 97% No data found.   Physical Exam  Constitutional: She appears well-developed and well-nourished.  HENT:  Head: Normocephalic.  Left Ear: External ear normal.  Eyes: Conjunctivae are normal. Pupils are equal, round, and reactive to light.  Neck: Normal range of motion. Neck supple.  Cardiovascular: Normal rate, regular rhythm and normal heart sounds.   Pulmonary/Chest: Effort normal and breath sounds normal.  Abdominal: Soft. Bowel sounds are normal.    ED Course  Procedures (including critical care time)  Labs Review Labs Reviewed  POCT URINALYSIS DIP (DEVICE) - Abnormal; Notable for the following:    Hgb urine dipstick LARGE (*)    Nitrite POSITIVE (*)    Leukocytes, UA LARGE (*)    All other components within normal limits  Imaging Review No results found.   Visual Acuity Review  Right Eye Distance:   Left Eye Distance:   Bilateral Distance:    Right Eye Near:   Left Eye Near:    Bilateral Near:         MDM  UTI - Cipro 500mg  one po bid x 10 days #20 Push po fluids, rest, tylenol and motrin otc prn as directed for fever, arthralgias, and myalgias.  Follow up prn if sx's continue or persist.    Lysbeth Penner, FNP 09/09/15 2028

## 2015-10-20 ENCOUNTER — Other Ambulatory Visit: Payer: Self-pay | Admitting: Internal Medicine

## 2015-10-20 DIAGNOSIS — Z1231 Encounter for screening mammogram for malignant neoplasm of breast: Secondary | ICD-10-CM

## 2015-11-18 ENCOUNTER — Ambulatory Visit: Payer: Medicare Other

## 2015-11-27 ENCOUNTER — Ambulatory Visit
Admission: RE | Admit: 2015-11-27 | Discharge: 2015-11-27 | Disposition: A | Discharge status: Home / Self Care | Payer: Medicare Other | Source: Ambulatory Visit | Attending: Internal Medicine | Admitting: Internal Medicine

## 2015-11-27 DIAGNOSIS — Z1231 Encounter for screening mammogram for malignant neoplasm of breast: Secondary | ICD-10-CM

## 2016-08-18 ENCOUNTER — Other Ambulatory Visit: Payer: Self-pay | Admitting: Internal Medicine

## 2016-08-18 DIAGNOSIS — E2839 Other primary ovarian failure: Secondary | ICD-10-CM

## 2016-08-24 ENCOUNTER — Ambulatory Visit
Admission: RE | Admit: 2016-08-24 | Discharge: 2016-08-24 | Disposition: A | Discharge status: Home / Self Care | Payer: Medicare Other | Source: Ambulatory Visit | Attending: Internal Medicine | Admitting: Internal Medicine

## 2016-08-24 ENCOUNTER — Other Ambulatory Visit: Payer: Medicare Other

## 2016-08-24 DIAGNOSIS — E2839 Other primary ovarian failure: Secondary | ICD-10-CM

## 2016-09-03 ENCOUNTER — Encounter (HOSPITAL_COMMUNITY): Payer: Self-pay | Admitting: *Deleted

## 2016-09-03 ENCOUNTER — Ambulatory Visit (HOSPITAL_COMMUNITY)
Admission: EM | Admit: 2016-09-03 | Discharge: 2016-09-03 | Disposition: A | Discharge status: Home / Self Care | Payer: Medicare Other | Attending: Internal Medicine | Admitting: Internal Medicine

## 2016-09-03 DIAGNOSIS — N390 Urinary tract infection, site not specified: Secondary | ICD-10-CM

## 2016-09-03 DIAGNOSIS — R319 Hematuria, unspecified: Secondary | ICD-10-CM | POA: Insufficient documentation

## 2016-09-03 DIAGNOSIS — I10 Essential (primary) hypertension: Secondary | ICD-10-CM | POA: Diagnosis not present

## 2016-09-03 DIAGNOSIS — R300 Dysuria: Secondary | ICD-10-CM

## 2016-09-03 LAB — POCT URINALYSIS DIP (DEVICE)
BILIRUBIN URINE: NEGATIVE
Glucose, UA: NEGATIVE mg/dL
Ketones, ur: NEGATIVE mg/dL
NITRITE: NEGATIVE
PH: 6.5 (ref 5.0–8.0)
Protein, ur: NEGATIVE mg/dL
Specific Gravity, Urine: 1.015 (ref 1.005–1.030)
UROBILINOGEN UA: 0.2 mg/dL (ref 0.0–1.0)

## 2016-09-03 MED ORDER — CIPROFLOXACIN HCL 500 MG PO TABS: 500.0000 mg | ORAL_TABLET | Freq: Two times a day (BID) | ORAL | 0 refills | Status: DC

## 2016-09-03 NOTE — ED Triage Notes (Signed)
Symptoms  Of  Ladder  Infection   Since last  Year          Scanty   Output      Pain  On     Urination  Low      abd  Pain

## 2016-09-03 NOTE — Discharge Instructions (Addendum)
Take your BP med ASAP. Rest,push fluids, take abx as  directed. If you develop new or worsening issues(fever, N,V, etc) go to Er for further evaluation. Return to UC as needed.

## 2016-09-03 NOTE — ED Provider Notes (Signed)
CSN: [id number]     Arrival date & time 09/03/16  1208 History   None    Chief Complaint  Patient presents with  . Urinary Tract Infection   (Consider location/radiation/quality/duration/timing/severity/associated sxs/prior Treatment) 76 yr old AA female presents to UC with cc of dysuria x 2 days, denies fever, abd pain or back pain. Pt states last time she had a UTI was ~ 1 year ago was seen here and med worked great. Pt's BP is elevated today, "fogot to take BP med, will take when gets home". Denies CP, palpitations or other complaints at present.    The history is provided by the patient. No language interpreter was used.  Dysuria  Pain quality:  Burning Pain severity:  Mild Onset quality:  Sudden Duration:  2 days Timing:  Constant Progression:  Worsening Chronicity:  Recurrent Recent urinary tract infections: no   Relieved by:  Nothing Exacerbated by: urination. Ineffective treatments:  None tried Urinary symptoms: frequent urination   Urinary symptoms comment:  Burning with urination Associated symptoms: no abdominal pain, no fever, no flank pain and no nausea     Past Medical History:  Diagnosis Date  . Arthritis   . Cataract   . Heart rate fast    hx of this, not current  . Hypertension    Past Surgical History:  Procedure Laterality Date  . ABDOMINAL HYSTERECTOMY    . COLONOSCOPY    . POLYPECTOMY    . TOE FUSION     Family History  Problem Relation Age of Onset  . Colon cancer Neg Hx   . Colon polyps Neg Hx   . Esophageal cancer Neg Hx   . Rectal cancer Neg Hx   . Stomach cancer Neg Hx    Social History  Substance Use Topics  . Smoking status: Former Research scientist (life sciences)  . Smokeless tobacco: Never Used  . Alcohol use 0.0 oz/week     Comment: occ beer and wine    OB History    No data available     Review of Systems  Constitutional: Negative for fever.  Cardiovascular: Negative for chest pain and palpitations.  Gastrointestinal: Negative for abdominal pain  and nausea.  Genitourinary: Positive for dysuria, frequency and urgency. Negative for flank pain and hematuria.  All other systems reviewed and are negative.   Allergies  Patient has no known allergies.  Home Medications   Prior to Admission medications   Medication Sig Start Date End Date Taking? Authorizing Provider  aspirin 81 MG chewable tablet Chew 81 mg by mouth daily.    [provider]  ciprofloxacin (CIPRO) 500 MG tablet Take 1 tablet (500 mg total) by mouth 2 (two) times daily. 0/48/88   Azuri Bozard, Jeanett Schlein, NP  Cyanocobalamin (VITAMIN B 12 PO) Take 2 capsules by mouth daily.    [provider]  lisinopril-hydrochlorothiazide (PRINZIDE,ZESTORETIC) 20-25 MG per tablet Take 1 tablet by mouth daily.  12/30/12   [provider]  meloxicam (MOBIC) 15 MG tablet Take 15 mg by mouth daily.  01/25/13   [provider]  Multiple Vitamin (MULTIVITAMIN) tablet Take 1 tablet by mouth daily.    [provider]   Meds Ordered and Administered this Visit  Medications - No data to display  BP (!) 170/101 (BP Location: Right Arm) Comment: reported elevated BP to nurse Balinda Quails  Pulse 86   Temp 98.7 F (37.1 C) (Oral)   Resp 18   SpO2 98%  No data found.   Physical  Exam  Constitutional: She is oriented to person, place, and time. She appears well-developed and well-nourished. She is active.  Neck: No tracheal deviation present.  Cardiovascular: Normal rate, regular rhythm and normal pulses.   Pulmonary/Chest: Effort normal.  Abdominal: Soft. Normal appearance and bowel sounds are normal. There is no tenderness.  Musculoskeletal:  -CVAT bilaterally  Neurological: She is alert and oriented to person, place, and time. GCS eye subscore is 4. GCS verbal subscore is 5. GCS motor subscore is 6.  Skin: Skin is warm. No rash noted.  Psychiatric: She has a normal mood and affect. Her speech is normal and behavior is normal.  Nursing note and  vitals reviewed.   Urgent Care Course     Procedures (including critical care time)  Labs Review Labs Reviewed  POCT URINALYSIS DIP (DEVICE) - Abnormal; Notable for the following:       Result Value   Hgb urine dipstick TRACE (*)    Leukocytes, UA SMALL (*)    All other components within normal limits  URINE CULTURE    Imaging Review No results found.       MDM   1. Dysuria   2. Urinary tract infection with hematuria, site unspecified   3. Essential hypertension     Take your BP med ASAP. Rest,push fluids, take abx as  directed. If you develop new or worsening issues(fever, N,V, etc) go to Er for further evaluation. Return to UC as needed. Pt verbalized understanding to this provider.   Tori Milks, NP 54/56/25 1355

## 2016-09-05 LAB — URINE CULTURE: Culture: >=100000 — AB

## 2016-12-12 ENCOUNTER — Other Ambulatory Visit: Payer: Self-pay | Admitting: Internal Medicine

## 2016-12-12 DIAGNOSIS — Z1231 Encounter for screening mammogram for malignant neoplasm of breast: Secondary | ICD-10-CM

## 2016-12-20 ENCOUNTER — Ambulatory Visit
Admission: RE | Admit: 2016-12-20 | Discharge: 2016-12-20 | Disposition: A | Discharge status: Home / Self Care | Payer: Medicare Other | Source: Ambulatory Visit | Attending: Internal Medicine | Admitting: Internal Medicine

## 2016-12-20 DIAGNOSIS — Z1231 Encounter for screening mammogram for malignant neoplasm of breast: Secondary | ICD-10-CM

## 2017-03-10 ENCOUNTER — Encounter (HOSPITAL_COMMUNITY): Payer: Self-pay | Admitting: Emergency Medicine

## 2017-03-10 ENCOUNTER — Ambulatory Visit (HOSPITAL_COMMUNITY)
Admission: EM | Admit: 2017-03-10 | Discharge: 2017-03-10 | Disposition: A | Discharge status: Home / Self Care | Payer: Medicare Other | Attending: Internal Medicine | Admitting: Internal Medicine

## 2017-03-10 DIAGNOSIS — N390 Urinary tract infection, site not specified: Secondary | ICD-10-CM

## 2017-03-10 LAB — POCT URINALYSIS DIP (DEVICE)
Bilirubin Urine: NEGATIVE
Glucose, UA: NEGATIVE mg/dL
Ketones, ur: NEGATIVE mg/dL
NITRITE: NEGATIVE
PH: 6.5 (ref 5.0–8.0)
PROTEIN: NEGATIVE mg/dL
Specific Gravity, Urine: 1.015 (ref 1.005–1.030)
UROBILINOGEN UA: 0.2 mg/dL (ref 0.0–1.0)

## 2017-03-10 MED ORDER — CEPHALEXIN 500 MG PO CAPS: 500.0000 mg | ORAL_CAPSULE | Freq: Three times a day (TID) | ORAL | 0 refills | Status: AC

## 2017-03-10 NOTE — ED Triage Notes (Signed)
PT reports dysuria for 2 weeks. PT reports she has taken some antibiotics since onset.

## 2017-03-10 NOTE — ED Provider Notes (Signed)
[place]    CSN: [id number] Arrival date & time: 03/10/17  1343     History   Chief Complaint Chief Complaint  Patient presents with  . Urinary Tract Infection    HPI April Tucker is a 76 y.o. female.   76 year old female, with history of hypertension, presenting today complaining of possible urinary tract infection.  Patient states that she has had dysuria, urinary frequency and urgency over the past 2 weeks.  Was seen by primary care on 12/19 and prescribed Macrobid.  Finish his antibiotics 2 days ago.  States that she felt somewhat better while on the antibiotics but her symptoms have returned since she stopped them.  She is inviting of mild suprapubic pressure.  Denies any flank pain, fever chills, nausea or vomiting.   The history is provided by the patient.  Urinary Tract Infection  Pain quality:  Burning Pain severity:  Mild Onset quality:  Gradual Duration:  2 weeks Timing:  Constant Progression:  Unchanged Chronicity:  Recurrent Recent urinary tract infections: yes   Relieved by:  Nothing Worsened by:  Nothing Ineffective treatments:  None tried Urinary symptoms: frequent urination   Urinary symptoms: no discolored urine, no foul-smelling urine, no hematuria, no hesitancy and no bladder incontinence   Associated symptoms: abdominal pain (mild suprapubic pressure )   Associated symptoms: no fever, no flank pain, no genital lesions, no nausea, no vaginal discharge and no vomiting   Risk factors: no hx of pyelonephritis, no hx of urolithiasis, no kidney transplant, not pregnant, no recurrent urinary tract infections, no renal cysts, no renal disease, not sexually active, no sexually transmitted infections, no single kidney and no urinary catheter     Past Medical History:  Diagnosis Date  . Arthritis   . Cataract   . Heart rate fast    hx of this, not current  . Hypertension     Patient Active Problem List   Diagnosis Date Noted  . POLYP,  COLON 01/06/2007  . HYPERTENSION 01/06/2007  . PALPITATIONS 01/06/2007    Past Surgical History:  Procedure Laterality Date  . ABDOMINAL HYSTERECTOMY    . COLONOSCOPY    . POLYPECTOMY    . TOE FUSION      OB History    No data available       Home Medications    Prior to Admission medications   Medication Sig Start Date End Date Taking? Authorizing Provider  aspirin 81 MG chewable tablet Chew 81 mg by mouth daily.   Yes [provider]  Cyanocobalamin (VITAMIN B 12 PO) Take 2 capsules by mouth daily.   Yes [provider]  lisinopril-hydrochlorothiazide (PRINZIDE,ZESTORETIC) 20-25 MG per tablet Take 1 tablet by mouth daily.  12/30/12  Yes [provider]  meloxicam (MOBIC) 15 MG tablet Take 15 mg by mouth daily.  01/25/13  Yes [provider]  Multiple Vitamin (MULTIVITAMIN) tablet Take 1 tablet by mouth daily.   Yes [provider]  cephALEXin (KEFLEX) 500 MG capsule Take 1 capsule (500 mg total) by mouth 3 (three) times daily for 7 days. 03/10/17 03/17/17  Blue, Olivia C, PA-C  ciprofloxacin (CIPRO) 500 MG tablet Take 1 tablet (500 mg total) by mouth 2 (two) times daily. 05/13/58   Defelice, Jeanett Schlein, NP    Family History Family History  Problem Relation Age of Onset  . Colon cancer Neg Hx   . Colon polyps Neg Hx   . Esophageal cancer Neg Hx   . Rectal cancer  Neg Hx   . Stomach cancer Neg Hx   . Breast cancer Neg Hx     Social History Social History   Tobacco Use  . Smoking status: Former Research scientist (life sciences)  . Smokeless tobacco: Never Used  Substance Use Topics  . Alcohol use: Yes    Alcohol/week: 0.0 oz    Comment: occ beer and wine   . Drug use: No     Allergies   Patient has no known allergies.   Review of Systems Review of Systems  Constitutional: Negative for chills and fever.  HENT: Negative for ear pain and sore throat.   Eyes: Negative for pain and visual disturbance.  Respiratory: Negative for cough and  shortness of breath.   Cardiovascular: Negative for chest pain and palpitations.  Gastrointestinal: Positive for abdominal pain (mild suprapubic pressure ). Negative for nausea and vomiting.  Genitourinary: Positive for dysuria, frequency and urgency. Negative for flank pain, hematuria and vaginal discharge.  Musculoskeletal: Negative for arthralgias and back pain.  Skin: Negative for color change and rash.  Neurological: Negative for seizures and syncope.  All other systems reviewed and are negative.    Physical Exam Triage Vital Signs ED Triage Vitals  Enc Vitals Group     BP 03/10/17 1413 130/81     Pulse Rate 03/10/17 1413 (!) 103     Resp 03/10/17 1413 16     Temp 03/10/17 1413 98.3 F (36.8 C)     Temp Source 03/10/17 1413 Oral     SpO2 03/10/17 1413 100 %     Weight 03/10/17 1414 222 lb (100.7 kg)     Height 03/10/17 1414 5\' 7"  (1.702 m)     Head Circumference --      Peak Flow --      Pain Score 03/10/17 1414 2     Pain Loc --      Pain Edu? --      Excl. in [place]? --    No data found.  Updated Vital Signs BP 130/81   Pulse (!) 103   Temp 98.3 F (36.8 C) (Oral)   Resp 16   Ht 5\' 7"  (1.702 m)   Wt 222 lb (100.7 kg)   SpO2 100%   BMI 34.77 kg/m   Visual Acuity Right Eye Distance:   Left Eye Distance:   Bilateral Distance:    Right Eye Near:   Left Eye Near:    Bilateral Near:     Physical Exam  Constitutional: She appears well-developed and well-nourished. No distress.  HENT:  Head: Normocephalic and atraumatic.  Eyes: Conjunctivae are normal.  Neck: Neck supple.  Cardiovascular: Normal rate and regular rhythm.  No murmur heard. Pulmonary/Chest: Effort normal and breath sounds normal. No respiratory distress.  Abdominal: Soft. There is no tenderness.  No abdominal or CVA tenderness  Musculoskeletal: She exhibits no edema.  Neurological: She is alert.  Skin: Skin is warm and dry.  Psychiatric: She has a normal mood and affect.  Nursing note and  vitals reviewed.    UC Treatments / Results  Labs (all labs ordered are listed, but only abnormal results are displayed) Labs Reviewed  POCT URINALYSIS DIP (DEVICE) - Abnormal; Notable for the following components:      Result Value   Hgb urine dipstick SMALL (*)    Leukocytes, UA LARGE (*)    All other components within normal limits    EKG  EKG Interpretation None       Radiology No results  found.  Procedures Procedures (including critical care time)  Medications Ordered in UC Medications - No data to display   Initial Impression / Assessment and Plan / UC Course  I have reviewed the triage vital signs and the nursing notes.  Pertinent labs & imaging results that were available during my care of the patient were reviewed by me and considered in my medical decision making (see chart for details).     Patient does have a urinary tract infection today.  Will treat with Keflex.  She is susceptible to this on most recent urine culture.  Final Clinical Impressions(s) / UC Diagnoses   Final diagnoses:  Acute UTI    ED Discharge Orders        Ordered    cephALEXin (KEFLEX) 500 MG capsule  3 times daily     03/10/17 1451       Controlled Substance Prescriptions [place] Controlled Substance Registry consulted? Not Applicable   Oren Section 03/10/17 1905

## 2017-03-10 NOTE — ED Triage Notes (Signed)
PT took 1 week course of macrobid that started 03/01/17

## 2017-08-23 ENCOUNTER — Ambulatory Visit (INDEPENDENT_AMBULATORY_CARE_PROVIDER_SITE_OTHER): Payer: Medicare Other

## 2017-08-23 ENCOUNTER — Encounter (HOSPITAL_COMMUNITY): Payer: Self-pay | Admitting: Emergency Medicine

## 2017-08-23 ENCOUNTER — Ambulatory Visit (HOSPITAL_COMMUNITY)
Admission: EM | Admit: 2017-08-23 | Discharge: 2017-08-23 | Disposition: A | Discharge status: Home / Self Care | Payer: Medicare Other | Attending: Family Medicine | Admitting: Family Medicine

## 2017-08-23 DIAGNOSIS — M25531 Pain in right wrist: Secondary | ICD-10-CM | POA: Diagnosis not present

## 2017-08-23 DIAGNOSIS — S52571A Other intraarticular fracture of lower end of right radius, initial encounter for closed fracture: Secondary | ICD-10-CM | POA: Diagnosis not present

## 2017-08-23 DIAGNOSIS — W19XXXA Unspecified fall, initial encounter: Secondary | ICD-10-CM | POA: Diagnosis not present

## 2017-08-23 DIAGNOSIS — S52611A Displaced fracture of right ulna styloid process, initial encounter for closed fracture: Secondary | ICD-10-CM | POA: Diagnosis not present

## 2017-08-23 NOTE — Discharge Instructions (Signed)
Splint applied today. Continue Meloxicam for pain. Ice compress, elevation. Follow up with orthopedics for further evaluation and management needed.   As discussed, as you are not sure if you hit your head. If experiencing worsening of symptoms, headache/blurry vision, nausea/vomiting, confusion/altered mental status, dizziness, weakness, passing out, imbalance, go to the emergency department for further evaluation.

## 2017-08-23 NOTE — ED Provider Notes (Signed)
[place]    CSN: [id number] Arrival date & time: 08/23/17  1339     History   Chief Complaint Chief Complaint  Patient presents with  . Wrist Pain    HPI April Tucker is a 77 y.o. female.   77 year old female comes in for right wrist pain after fall.  States she slipped to the bathroom, was falling backwards and tried to use her right arm to catch her fall.  States right wrist pain.  She is unsure of head injury, but denies loss of consciousness.  Denies headache, blurry vision, dizziness, weakness, syncope.  Denies nausea, vomiting.  Denies blood thinner use.  Swelling to the radial aspect of the wrist with decreased range of motion.  She denies any hand pain.  Denies numbness, tingling.  Has not taken anything for the symptoms.  Put her arm in a sling to come for evaluation.     Past Medical History:  Diagnosis Date  . Arthritis   . Cataract   . Heart rate fast    hx of this, not current  . Hypertension     Patient Active Problem List   Diagnosis Date Noted  . POLYP, COLON 01/06/2007  . HYPERTENSION 01/06/2007  . PALPITATIONS 01/06/2007    Past Surgical History:  Procedure Laterality Date  . ABDOMINAL HYSTERECTOMY    . COLONOSCOPY    . POLYPECTOMY    . TOE FUSION      OB History   None      Home Medications    Prior to Admission medications   Medication Sig Start Date End Date Taking? Authorizing Provider  aspirin 81 MG chewable tablet Chew 81 mg by mouth daily.    [provider]  ciprofloxacin (CIPRO) 500 MG tablet Take 1 tablet (500 mg total) by mouth 2 (two) times daily. 9/37/16   Defelice, Jeanett Schlein, NP  Cyanocobalamin (VITAMIN B 12 PO) Take 2 capsules by mouth daily.    [provider]  lisinopril-hydrochlorothiazide (PRINZIDE,ZESTORETIC) 20-25 MG per tablet Take 1 tablet by mouth daily.  12/30/12   [provider]  meloxicam (MOBIC) 15 MG tablet Take 15 mg by mouth daily.  01/25/13   [provider]  Multiple Vitamin (MULTIVITAMIN) tablet Take 1 tablet by mouth daily.    [provider]    Family History Family History  Problem Relation Age of Onset  . Colon cancer Neg Hx   . Colon polyps Neg Hx   . Esophageal cancer Neg Hx   . Rectal cancer Neg Hx   . Stomach cancer Neg Hx   . Breast cancer Neg Hx     Social History Social History   Tobacco Use  . Smoking status: Former Research scientist (life sciences)  . Smokeless tobacco: Never Used  Substance Use Topics  . Alcohol use: Yes    Alcohol/week: 0.0 oz    Comment: occ beer and wine   . Drug use: No     Allergies   Patient has no known allergies.   Review of Systems Review of Systems  Reason unable to perform ROS: See HPI as above.     Physical Exam Triage Vital Signs ED Triage Vitals [08/23/17 1409]  Enc Vitals Group     BP (!) 160/84     Pulse Rate 92     Resp 18     Temp 97.9 F (36.6 C)     Temp Source Oral     SpO2 100 %  Weight      Height      Head Circumference      Peak Flow      Pain Score      Pain Loc      Pain Edu?      Excl. in [place]?    No data found.  Updated Vital Signs BP (!) 160/84 (BP Location: Left Arm)   Pulse 92   Temp 97.9 F (36.6 C) (Oral)   Resp 18   SpO2 100%   Physical Exam  Constitutional: She is oriented to person, place, and time. She appears well-developed and well-nourished. No distress.  HENT:  Head: Normocephalic and atraumatic.  Eyes: Pupils are equal, round, and reactive to light. Conjunctivae are normal.  Musculoskeletal:  Swelling to the radial aspect of the wrist.  Tenderness to palpation of radial wrist, no tenderness to palpation of ulnar wrist.  No tenderness to palpation of the hand.  Range of motion deferred.  Sensation intact and equal bilaterally.  Radial pulse 2+ and equal bilaterally.  Cap refill less than 2 seconds.  Neurological: She is alert and oriented to person, place, and time. She is not disoriented. Coordination and gait normal.     UC  Treatments / Results  Labs (all labs ordered are listed, but only abnormal results are displayed) Labs Reviewed - No data to display  EKG None  Radiology Dg Wrist Complete Right  Result Date: 08/23/2017 CLINICAL DATA:  [place] earlier today and caught herself with her RIGHT hand, distal RIGHT radial and ulnar pain EXAM: RIGHT WRIST - COMPLETE 3+ VIEW COMPARISON:  None FINDINGS: Osseous demineralization. Joint space narrowing at first [facility] joint and at distal pole of scaphoid. Transverse impacted distal radial metaphyseal fracture with intra-articular extension into the radiocarpal joint and minimal dorsal tilt of distal radial articular surface. Minimally displaced ulnar styloid fracture. Congenital lunatotriquetral fusion. No additional fracture, dislocation, or bone destruction. Associated soft tissue swelling at wrist. IMPRESSION: Displaced RIGHT ulnar styloid fracture. Comminuted impacted intra-articular distal RIGHT radial metaphyseal fracture. Electronically Signed   By: Lavonia Dana M.D.   On: 08/23/2017 14:25    Procedures Procedures (including critical care time)  Medications Ordered in UC Medications - No data to display  Initial Impression / Assessment and Plan / UC Course  I have reviewed the triage vital signs and the nursing notes.  Pertinent labs & imaging results that were available during my care of the patient were reviewed by me and considered in my medical decision making (see chart for details).    Discussed x-ray results with patient.  Patient takes Mobic for arthritis of the knee, and thinks that can control her pain.  We will have her continue Mobic.  Ice compress, elevation.  Splint applied today.  Follow-up with orthopedics for further evaluation and management needed.  Return precautions given.  Patient with unsure head injury, however currently asymptomatic.  No loss of consciousness.  Able to ambulate on own without difficulty.  Will have patient monitor for now.   Return precautions given.  Patient expresses understanding and agrees to plan.  Final Clinical Impressions(s) / UC Diagnoses   Final diagnoses:  Other closed intra-articular fracture of distal end of right radius, initial encounter  Closed displaced fracture of styloid process of right ulna, initial encounter    ED Prescriptions    None        Ok Edwards, PA-C 08/23/17 1452

## 2017-08-23 NOTE — ED Triage Notes (Signed)
Pt with right wrist pain and trip and fall today

## 2017-08-23 NOTE — Progress Notes (Signed)
Orthopedic Tech Progress Note Patient Details:  April Tucker May 01, 1940 [id number]  Ortho Devices Type of Ortho Device: [place]ong splint Ortho Device/Splint Location: rue Ortho Device/Splint Interventions: Application   Post Interventions Patient Tolerated: Well Instructions Provided: Care of device   Hildred Priest 08/23/2017, 3:13 PM

## 2018-01-18 ENCOUNTER — Other Ambulatory Visit: Payer: Self-pay | Admitting: Internal Medicine

## 2018-01-18 DIAGNOSIS — Z1231 Encounter for screening mammogram for malignant neoplasm of breast: Secondary | ICD-10-CM

## 2018-01-22 ENCOUNTER — Ambulatory Visit
Admission: RE | Admit: 2018-01-22 | Discharge: 2018-01-22 | Disposition: A | Discharge status: Home / Self Care | Payer: Medicare Other | Source: Ambulatory Visit | Attending: Internal Medicine | Admitting: Internal Medicine

## 2018-01-22 DIAGNOSIS — Z1231 Encounter for screening mammogram for malignant neoplasm of breast: Secondary | ICD-10-CM

## 2018-05-01 ENCOUNTER — Encounter: Payer: Self-pay | Admitting: Internal Medicine

## 2019-01-08 ENCOUNTER — Other Ambulatory Visit: Payer: Self-pay | Admitting: Internal Medicine

## 2019-01-08 DIAGNOSIS — Z1231 Encounter for screening mammogram for malignant neoplasm of breast: Secondary | ICD-10-CM

## 2019-02-26 ENCOUNTER — Other Ambulatory Visit: Payer: Self-pay

## 2019-02-26 ENCOUNTER — Ambulatory Visit
Admission: RE | Admit: 2019-02-26 | Discharge: 2019-02-26 | Disposition: A | Discharge status: Home / Self Care | Payer: Medicare Other | Source: Ambulatory Visit | Attending: Internal Medicine | Admitting: Internal Medicine

## 2019-02-26 DIAGNOSIS — Z1231 Encounter for screening mammogram for malignant neoplasm of breast: Secondary | ICD-10-CM

## 2019-03-28 ENCOUNTER — Encounter (HOSPITAL_COMMUNITY): Payer: Self-pay | Admitting: Emergency Medicine

## 2019-03-28 ENCOUNTER — Other Ambulatory Visit: Payer: Self-pay

## 2019-03-28 ENCOUNTER — Ambulatory Visit (HOSPITAL_COMMUNITY)
Admission: EM | Admit: 2019-03-28 | Discharge: 2019-03-28 | Disposition: A | Discharge status: Home / Self Care | Payer: Medicare Other | Attending: Family Medicine | Admitting: Family Medicine

## 2019-03-28 DIAGNOSIS — M545 Low back pain, unspecified: Secondary | ICD-10-CM

## 2019-03-28 MED ORDER — PREDNISONE 10 MG PO TABS: 20.0000 mg | ORAL_TABLET | Freq: Every day | ORAL | 0 refills | Status: DC

## 2019-03-28 MED ORDER — HYDROCODONE-ACETAMINOPHEN 5-325 MG PO TABS: 2.0000 | ORAL_TABLET | ORAL | 0 refills | Status: DC | PRN

## 2019-03-28 NOTE — ED Triage Notes (Signed)
Left lower back pain is "excrutiating"  Pain worsened over Friday, Saturday and Monday.  Patient has had this pain over the years.  Patient started seeing chiropractor on 01/12/2019.  Patient has left leg pain

## 2019-03-28 NOTE — ED Provider Notes (Signed)
[place]    CSN: [id number] Arrival date & time: 03/28/19  [place]      History   Chief Complaint Chief Complaint  Patient presents with  . Back Pain    HPI April Tucker is a 79 y.o. female.   Patient has left-sided back pain with radiation down her leg to the mid calf.  She is seeing a chiropractor but pain seems to be getting worse.  She tells me that she does have a history of disc disease.  HPI  Past Medical History:  Diagnosis Date  . Arthritis   . Cataract   . Heart rate fast    hx of this, not current  . Hypertension     Patient Active Problem List   Diagnosis Date Noted  . POLYP, COLON 01/06/2007  . HYPERTENSION 01/06/2007  . PALPITATIONS 01/06/2007    Past Surgical History:  Procedure Laterality Date  . ABDOMINAL HYSTERECTOMY    . COLONOSCOPY    . POLYPECTOMY    . TOE FUSION      OB History   No obstetric history on file.      Home Medications    Prior to Admission medications   Medication Sig Start Date End Date Taking? Authorizing Provider  aspirin 81 MG chewable tablet Chew 81 mg by mouth daily.   Yes [provider]  Cyanocobalamin (VITAMIN B 12 PO) Take 2 capsules by mouth daily.   Yes [provider]  lisinopril-hydrochlorothiazide (PRINZIDE,ZESTORETIC) 20-25 MG per tablet Take 1 tablet by mouth daily.  12/30/12  Yes [provider]  meloxicam (MOBIC) 15 MG tablet Take 15 mg by mouth daily.  01/25/13  Yes [provider]  Multiple Vitamin (MULTIVITAMIN) tablet Take 1 tablet by mouth daily.   Yes [provider]  ciprofloxacin (CIPRO) 500 MG tablet Take 1 tablet (500 mg total) by mouth 2 (two) times daily. [id number]   Defelice, Jeanett Schlein, NP    Family History Family History  Problem Relation Age of Onset  . Colon cancer Neg Hx   . Colon polyps Neg Hx   . Esophageal cancer Neg Hx   . Rectal cancer Neg Hx   . Stomach cancer Neg Hx   . Breast cancer Neg Hx     Social  History Social History   Tobacco Use  . Smoking status: Former Research scientist (life sciences)  . Smokeless tobacco: Never Used  Substance Use Topics  . Alcohol use: Yes    Alcohol/week: 0.0 standard drinks    Comment: occ beer and wine   . Drug use: No     Allergies   Patient has no known allergies.   Review of Systems Review of Systems  Musculoskeletal: Positive for back pain and gait problem.  All other systems reviewed and are negative.    Physical Exam Triage Vital Signs ED Triage Vitals  Enc Vitals Group     BP 03/28/19 1855 125/78     Pulse Rate 03/28/19 1855 (!) 104     Resp 03/28/19 1855 20     Temp 03/28/19 1855 98.7 F (37.1 C)     Temp Source 03/28/19 1855 Oral     SpO2 03/28/19 1855 100 %     Weight --      Height --      Head Circumference --      Peak Flow --      Pain Score 03/28/19 1851 10     Pain Loc --      Pain  Edu? --      Excl. in [place]? --    No data found.  Updated Vital Signs BP 125/78 (BP Location: Right Arm)   Pulse (!) 104   Temp 98.7 F (37.1 C) (Oral)   Resp 20   SpO2 100%   Visual Acuity Right Eye Distance:   Left Eye Distance:   Bilateral Distance:    Right Eye Near:   Left Eye Near:    Bilateral Near:     Physical Exam Vitals and nursing note reviewed.  Constitutional:      Appearance: Normal appearance. She is obese.  Cardiovascular:     Rate and Rhythm: Normal rate and regular rhythm.  Pulmonary:     Effort: Pulmonary effort is normal.     Breath sounds: Normal breath sounds.  Musculoskeletal:     Comments: Back decreased range of motion Some tenderness in the left lumbar area Straight leg raising is positive on the left at 90 degrees Deep tendon reflexes show decreased knee jerk on the left Normal heel and toe walking  Neurological:     Mental Status: She is alert.      UC Treatments / Results  Labs (all labs ordered are listed, but only abnormal results are displayed) Labs Reviewed - No data to  display  EKG   Radiology No results found.  Procedures Procedures (including critical care time)  Medications Ordered in UC Medications - No data to display  Initial Impression / Assessment and Plan / UC Course  I have reviewed the triage vital signs and the nursing notes.  Pertinent labs & imaging results that were available during my care of the patient were reviewed by me and considered in my medical decision making (see chart for details).     Back pain secondary to disc disease Final Clinical Impressions(s) / UC Diagnoses   Final diagnoses:  None   Discharge Instructions   None    ED Prescriptions    None     PDMP not reviewed this encounter.   Wardell Honour, MD 03/28/19 [phone]

## 2019-05-06 ENCOUNTER — Ambulatory Visit: Payer: Medicare Other | Attending: Family

## 2019-05-06 DIAGNOSIS — Z23 Encounter for immunization: Secondary | ICD-10-CM | POA: Insufficient documentation

## 2019-05-06 NOTE — Progress Notes (Signed)
   Covid-19 Vaccination Clinic  Name:  April Tucker    MRN: [id number] DOB: 05/12/1940  05/06/2019  April Tucker was observed post Covid-19 immunization for 15 minutes without incidence. She was provided with Vaccine Information Sheet and instruction to access the V-Safe system.   April Tucker was instructed to call 911 with any severe reactions post vaccine: Marland Kitchen Difficulty breathing  . Swelling of your face and throat  . A fast heartbeat  . A bad rash all over your body  . Dizziness and weakness    Immunizations Administered    Name Date Dose VIS Date Route   Moderna COVID-19 Vaccine 05/06/2019 12:05 PM 0.5 mL 02/12/2019 Intramuscular   Manufacturer: Moderna   Lot: [id number]   [id number]

## 2019-06-04 ENCOUNTER — Ambulatory Visit: Payer: Medicare Other | Attending: Family

## 2019-06-04 DIAGNOSIS — Z23 Encounter for immunization: Secondary | ICD-10-CM

## 2019-06-04 NOTE — Progress Notes (Signed)
   Covid-19 Vaccination Clinic  Name:  April Tucker    MRN: [id number] DOB: 1940-07-18  06/04/2019  April Tucker was observed post Covid-19 immunization for 15 minutes without incident. She was provided with Vaccine Information Sheet and instruction to access the V-Safe system.   April Tucker was instructed to call 911 with any severe reactions post vaccine: Marland Kitchen Difficulty breathing  . Swelling of face and throat  . A fast heartbeat  . A bad rash all over body  . Dizziness and weakness   Immunizations Administered    Name Date Dose VIS Date Route   Moderna COVID-19 Vaccine 06/04/2019  3:26 PM 0.5 mL 02/12/2019 Intramuscular   Manufacturer: Moderna   LotMV:[id number]   [id number]

## 2019-07-08 ENCOUNTER — Ambulatory Visit (HOSPITAL_COMMUNITY)
Admission: EM | Admit: 2019-07-08 | Discharge: 2019-07-08 | Disposition: A | Discharge status: Home / Self Care | Payer: Medicare Other | Attending: Family Medicine | Admitting: Family Medicine

## 2019-07-08 ENCOUNTER — Other Ambulatory Visit: Payer: Self-pay

## 2019-07-08 DIAGNOSIS — M5442 Lumbago with sciatica, left side: Secondary | ICD-10-CM | POA: Diagnosis not present

## 2019-07-08 DIAGNOSIS — R1084 Generalized abdominal pain: Secondary | ICD-10-CM | POA: Diagnosis not present

## 2019-07-08 MED ORDER — PREDNISONE 20 MG PO TABS: 20.0000 mg | ORAL_TABLET | Freq: Every day | ORAL | 0 refills | Status: AC

## 2019-07-08 MED ORDER — LIDOCAINE VISCOUS HCL 2 % MT SOLN
15.0000 mL | Freq: Once | OROMUCOSAL | Status: AC
  Administered 2019-07-08: 09:00:00 15 mL via ORAL

## 2019-07-08 MED ORDER — SUCRALFATE 1 GM/10ML PO SUSP: 1.0000 g | Freq: Three times a day (TID) | ORAL | 0 refills | Status: AC

## 2019-07-08 MED ORDER — LIDOCAINE VISCOUS HCL 2 % MT SOLN
OROMUCOSAL | Status: AC
  Filled 2019-07-08: qty 15

## 2019-07-08 MED ORDER — ALUM & MAG HYDROXIDE-SIMETH 200-200-20 MG/5ML PO SUSP
30.0000 mL | Freq: Once | ORAL | Status: AC
  Administered 2019-07-08: 09:00:00 30 mL via ORAL

## 2019-07-08 MED ORDER — OMEPRAZOLE 20 MG PO CPDR: 20.0000 mg | DELAYED_RELEASE_CAPSULE | Freq: Two times a day (BID) | ORAL | 0 refills | Status: AC

## 2019-07-08 MED ORDER — HYDROCODONE-ACETAMINOPHEN 5-325 MG PO TABS: 1.0000 | ORAL_TABLET | Freq: Four times a day (QID) | ORAL | 0 refills | Status: DC | PRN

## 2019-07-08 MED ORDER — FAMOTIDINE 20 MG PO TABS: 20.0000 mg | ORAL_TABLET | Freq: Two times a day (BID) | ORAL | 0 refills | Status: DC

## 2019-07-08 MED ORDER — ALUM & MAG HYDROXIDE-SIMETH 200-200-20 MG/5ML PO SUSP
ORAL | Status: AC
Start: 2019-07-08 — End: ?
  Filled 2019-07-08: qty 30

## 2019-07-08 NOTE — Discharge Instructions (Addendum)
Stomach Pain Begin omeprazole twice daily consistently over the next 2 weeks May also try using Carafate before meals and at bedtime to further help with discomfort May supplement with over-the-counter Pepcid, Maalox for more immediate symptom relief Avoid ibuprofen/Aleve  Sciatica Please restart taking prednisone 20 mg daily for the next week, please take with food on your stomach to avoid further stomach upset Please use hydrocodone for severe pain, use sparingly, do not drive after taking, may cause drowsiness  Please follow-up if any symptoms not improving or worsening, developing increased abdominal pain, blood in stool, vomiting, fevers, issues with urination/bowel movements

## 2019-07-08 NOTE — ED Provider Notes (Signed)
[place]    CSN: [id number] Arrival date & time: 07/08/19  0801      History   Chief Complaint Chief Complaint  Patient presents with  . Abdominal Pain  . Sciatica    HPI April Tucker is a 79 y.o. female history of hypertension, arthritis, presenting today for evaluation of possible reflux and sciatica.  Patient notes over the past couple days she has had an irritation and discomfort in her abdomen.  She also reports that she has had a bad taste in her mouth.  She has used over-the-counter Tums with some improvement.  She also notes that with eating her symptoms improve as well.  She denies any associated diarrhea or constipation.  Has had regular bowel movements daily without straining.  She denies any nausea or vomiting.  Denies any urinary symptoms of dysuria, increased frequency or urgency.  She does use Tylenol frequently.  Reports infrequent alcohol use.  Denies significant history of reflux.  She also reports that her sciatica has flared over the past 5 days.  She denies any new injury fall or trauma, denies any increase in activity or heavy lifting.  Feels similar to past sciatica.  Occasionally radiates into left leg.  Denies leg weakness.  On chart review patient was seen here 3 months ago in January and was given prednisone and hydrocodone, patient reports improvement with these medicines.  She also goes to chiropractor for her disc disease.  HPI  Past Medical History:  Diagnosis Date  . Arthritis   . Cataract   . Heart rate fast    hx of this, not current  . Hypertension     Patient Active Problem List   Diagnosis Date Noted  . POLYP, COLON 01/06/2007  . HYPERTENSION 01/06/2007  . PALPITATIONS 01/06/2007    Past Surgical History:  Procedure Laterality Date  . ABDOMINAL HYSTERECTOMY    . COLONOSCOPY    . POLYPECTOMY    . TOE FUSION      OB History   No obstetric history on file.      Home Medications    Prior to Admission  medications   Medication Sig Start Date End Date Taking? Authorizing Provider  aspirin 81 MG chewable tablet Chew 81 mg by mouth daily.    [provider]  Cyanocobalamin (VITAMIN B 12 PO) Take 2 capsules by mouth daily.    [provider]  famotidine (PEPCID) 20 MG tablet Take 1 tablet (20 mg total) by mouth 2 (two) times daily. 07/08/19   Anela Bensman C, PA-C  HYDROcodone-acetaminophen (NORCO/VICODIN) 5-325 MG tablet Take 1-2 tablets by mouth every 6 (six) hours as needed for severe pain. 07/08/19   Narissa Beaufort C, PA-C  lisinopril-hydrochlorothiazide (PRINZIDE,ZESTORETIC) 20-25 MG per tablet Take 1 tablet by mouth daily.  12/30/12   [provider]  meloxicam (MOBIC) 15 MG tablet Take 15 mg by mouth daily.  01/25/13   [provider]  Multiple Vitamin (MULTIVITAMIN) tablet Take 1 tablet by mouth daily.    [provider]  omeprazole (PRILOSEC) 20 MG capsule Take 1 capsule (20 mg total) by mouth 2 (two) times daily before a meal for 14 days. 07/08/19 07/22/19  Jersie Beel C, PA-C  predniSONE (DELTASONE) 20 MG tablet Take 1 tablet (20 mg total) by mouth daily with breakfast for 7 days. 07/08/19 07/15/19  Reeta Kuk C, PA-C  sucralfate (CARAFATE) 1 GM/10ML suspension Take 10 mLs (1 g total) by mouth 4 (four) times daily -  with meals and at bedtime. 07/08/19   Danyell Awbrey, Elesa Hacker, PA-C    Family History Family History  Problem Relation Age of Onset  . Colon cancer Neg Hx   . Colon polyps Neg Hx   . Esophageal cancer Neg Hx   . Rectal cancer Neg Hx   . Stomach cancer Neg Hx   . Breast cancer Neg Hx     Social History Social History   Tobacco Use  . Smoking status: Former Research scientist (life sciences)  . Smokeless tobacco: Never Used  Substance Use Topics  . Alcohol use: Yes    Alcohol/week: 0.0 standard drinks    Comment: occ beer and wine   . Drug use: No     Allergies   Patient has no known allergies.   Review of Systems Review of Systems    Constitutional: Negative for fever.  Respiratory: Negative for shortness of breath.   Cardiovascular: Negative for chest pain.  Gastrointestinal: Positive for abdominal pain. Negative for diarrhea, nausea and vomiting.  Genitourinary: Negative for dysuria, flank pain, genital sores, hematuria, menstrual problem, vaginal bleeding, vaginal discharge and vaginal pain.  Musculoskeletal: Positive for back pain and myalgias.  Skin: Negative for rash.  Neurological: Negative for dizziness, light-headedness and headaches.     Physical Exam Triage Vital Signs ED Triage Vitals  Enc Vitals Group     BP      Pulse      Resp      Temp      Temp src      SpO2      Weight      Height      Head Circumference      Peak Flow      Pain Score      Pain Loc      Pain Edu?      Excl. in [place]?    No data found.  Updated Vital Signs BP (!) 145/87   Pulse 84   Temp 98.4 F (36.9 C)   Resp 16   SpO2 94%   Visual Acuity Right Eye Distance:   Left Eye Distance:   Bilateral Distance:    Right Eye Near:   Left Eye Near:    Bilateral Near:     Physical Exam Vitals and nursing note reviewed.  Constitutional:      Appearance: She is well-developed.     Comments: No acute distress  HENT:     Head: Normocephalic and atraumatic.     Nose: Nose normal.     Mouth/Throat:     Comments: Oral mucosa pink and moist, no tonsillar enlargement or exudate. Posterior pharynx patent and nonerythematous, no uvula deviation or swelling. Normal phonation. Eyes:     Conjunctiva/sclera: Conjunctivae normal.  Cardiovascular:     Rate and Rhythm: Normal rate and regular rhythm.  Pulmonary:     Effort: Pulmonary effort is normal. No respiratory distress.     Comments: Breathing comfortably at rest, CTABL, no wheezing, rales or other adventitious sounds auscultated Abdominal:     General: There is no distension.     Tenderness: There is abdominal tenderness.     Comments: Tenderness to palpation to right  lower quadrant and epigastrium, negative rebound, negative Rovsing, negative McBurney's, negative Murphy's  Musculoskeletal:        General: Normal range of motion.     Cervical back: Neck supple.     Comments: Nontender to palpation along lumbar spine midline, increased tenderness to palpation of left lower  lumbar musculature more laterally  Strength at hips and knees 5/5 and equal bilaterally, patellar reflex 1+ bilaterally  Skin:    General: Skin is warm and dry.  Neurological:     Mental Status: She is alert and oriented to person, place, and time.      UC Treatments / Results  Labs (all labs ordered are listed, but only abnormal results are displayed) Labs Reviewed - No data to display  EKG   Radiology No results found.  Procedures Procedures (including critical care time)  Medications Ordered in UC Medications  alum & mag hydroxide-simeth (MAALOX/MYLANTA) 200-200-20 MG/5ML suspension 30 mL (30 mLs Oral Given 07/08/19 0852)    And  lidocaine (XYLOCAINE) 2 % viscous mouth solution 15 mL (15 mLs Oral Given 07/08/19 [id number])    Initial Impression / Assessment and Plan / UC Course  I have reviewed the triage vital signs and the nursing notes.  Pertinent labs & imaging results that were available during my care of the patient were reviewed by me and considered in my medical decision making (see chart for details).     Abdominal pain: Suspicious of GERD/PUD.  Symptoms improving with eating.  Less suspicious of gallbladder pathology.  Mild improvement with GI cocktail.  We will proceed with initiating on PPI and further symptomatic control.  Advised to follow-up if symptoms not improving over the next 1 to 2 weeks that she may need gastroenterology referral.  Back pain: Left-sided sciatica, previously improved with prednisone and given setting of some neck issues will avoid NSAIDs.  Will repeat course of prednisone at low dose, recommended take with food.  Hydrocodone for severe  pain.  No red flags for cauda equina.  Discussed strict return precautions. Patient verbalized understanding and is agreeable with plan.  Final Clinical Impressions(s) / UC Diagnoses   Final diagnoses:  Generalized abdominal pain  Acute left-sided low back pain with left-sided sciatica     Discharge Instructions     Stomach Pain Begin omeprazole twice daily consistently over the next 2 weeks May also try using Carafate before meals and at bedtime to further help with discomfort May supplement with over-the-counter Pepcid, Maalox for more immediate symptom relief Avoid ibuprofen/Aleve  Sciatica Please restart taking prednisone 20 mg daily for the next week, please take with food on your stomach to avoid further stomach upset Please use hydrocodone for severe pain, use sparingly, do not drive after taking, may cause drowsiness  Please follow-up if any symptoms not improving or worsening, developing increased abdominal pain, blood in stool, vomiting, fevers, issues with urination/bowel movements    ED Prescriptions    Medication Sig Dispense Auth. Provider   omeprazole (PRILOSEC) 20 MG capsule Take 1 capsule (20 mg total) by mouth 2 (two) times daily before a meal for 14 days. 28 capsule Tomeeka Plaugher C, PA-C   sucralfate (CARAFATE) 1 GM/10ML suspension Take 10 mLs (1 g total) by mouth 4 (four) times daily -  with meals and at bedtime. 420 mL Colbin Jovel C, PA-C   famotidine (PEPCID) 20 MG tablet Take 1 tablet (20 mg total) by mouth 2 (two) times daily. 30 tablet Jackelin Correia C, PA-C   predniSONE (DELTASONE) 20 MG tablet Take 1 tablet (20 mg total) by mouth daily with breakfast for 7 days. 7 tablet Kyston Gonce C, PA-C   HYDROcodone-acetaminophen (NORCO/VICODIN) 5-325 MG tablet Take 1-2 tablets by mouth every 6 (six) hours as needed for severe pain. 8 tablet Makiya Jeune, [place] C, PA-C  I have reviewed the PDMP during this encounter.   Janith Lima, [place] 07/08/19  [phone]

## 2019-07-08 NOTE — ED Triage Notes (Signed)
Pt c/o LLQ abdominal pain all night last night with a "bad taste in my mouth." Pt denies constipation, nausea/vomiting, dysuria, urinary frequency.   Pt has sciatica and has been having back pain.

## 2019-08-11 ENCOUNTER — Ambulatory Visit (HOSPITAL_COMMUNITY)
Admission: EM | Admit: 2019-08-11 | Discharge: 2019-08-11 | Disposition: A | Discharge status: Home / Self Care | Payer: Medicare Other | Attending: Family Medicine | Admitting: Family Medicine

## 2019-08-11 ENCOUNTER — Encounter (HOSPITAL_COMMUNITY): Payer: Self-pay

## 2019-08-11 ENCOUNTER — Other Ambulatory Visit: Payer: Self-pay

## 2019-08-11 DIAGNOSIS — M5432 Sciatica, left side: Secondary | ICD-10-CM

## 2019-08-11 DIAGNOSIS — R109 Unspecified abdominal pain: Secondary | ICD-10-CM | POA: Diagnosis not present

## 2019-08-11 LAB — POCT URINALYSIS DIP (DEVICE)
Bilirubin Urine: NEGATIVE
Glucose, UA: NEGATIVE mg/dL
Ketones, ur: NEGATIVE mg/dL
Leukocytes,Ua: NEGATIVE
Nitrite: NEGATIVE
Protein, ur: NEGATIVE mg/dL
Specific Gravity, Urine: 1.02 (ref 1.005–1.030)
Urobilinogen, UA: 0.2 mg/dL (ref 0.0–1.0)
pH: 6 (ref 5.0–8.0)

## 2019-08-11 NOTE — ED Provider Notes (Signed)
[place]   [id number] 08/11/19 Arrival Time: 1012  [id number] PAIN  SUBJECTIVE: History from: patient. April Tucker is a 79 y.o. female complains of left flank and low back pain that began about 2 weeks ago. Denies a precipitating event or specific injury. Localizes the pain to the L flank, L low back and around L low abd. Describes the pain as ]intermittent and aggravating in character. Has tried OTC medications with relief. Symptoms are made worse with activity.  Denies similar symptoms in the past. Reports that she is seeing a chiropractor for L sciatica about every 2 weeks. Denies fever, chills, erythema, ecchymosis, effusion, weakness, numbness and tingling, saddle paresthesias, loss of bowel or bladder function.      ROS: As per HPI.  All other pertinent ROS negative.     Past Medical History:  Diagnosis Date  . Arthritis   . Cataract   . Heart rate fast    hx of this, not current  . Hypertension    Past Surgical History:  Procedure Laterality Date  . ABDOMINAL HYSTERECTOMY    . COLONOSCOPY    . POLYPECTOMY    . TOE FUSION     No Known Allergies No current facility-administered medications on file prior to encounter.   Current Outpatient Medications on File Prior to Encounter  Medication Sig Dispense Refill  . aspirin 81 MG chewable tablet Chew 81 mg by mouth daily.    . Cyanocobalamin (VITAMIN B 12 PO) Take 2 capsules by mouth daily.    . famotidine (PEPCID) 20 MG tablet Take 1 tablet (20 mg total) by mouth 2 (two) times daily. 30 tablet 0  . HYDROcodone-acetaminophen (NORCO/VICODIN) 5-325 MG tablet Take 1-2 tablets by mouth every 6 (six) hours as needed for severe pain. 8 tablet 0  . lisinopril-hydrochlorothiazide (PRINZIDE,ZESTORETIC) 20-25 MG per tablet Take 1 tablet by mouth daily.     . meloxicam (MOBIC) 15 MG tablet Take 15 mg by mouth daily.     . Multiple Vitamin (MULTIVITAMIN) tablet Take 1 tablet by mouth daily.    Marland Kitchen omeprazole (PRILOSEC) 20 MG  capsule Take 1 capsule (20 mg total) by mouth 2 (two) times daily before a meal for 14 days. 28 capsule 0  . sucralfate (CARAFATE) 1 GM/10ML suspension Take 10 mLs (1 g total) by mouth 4 (four) times daily -  with meals and at bedtime. 420 mL 0   Social History   Socioeconomic History  . Marital status: Single    Spouse name: Not on file  . Number of children: Not on file  . Years of education: Not on file  . Highest education level: Not on file  Occupational History  . Not on file  Tobacco Use  . Smoking status: Former Research scientist (life sciences)  . Smokeless tobacco: Never Used  Substance and Sexual Activity  . Alcohol use: Yes    Alcohol/week: 0.0 standard drinks    Comment: occ beer and wine   . Drug use: No  . Sexual activity: Not on file  Other Topics Concern  . Not on file  Social History Narrative  . Not on file   Social Determinants of Health   Financial Resource Strain:   . Difficulty of Paying Living Expenses:   Food Insecurity:   . Worried About Charity fundraiser in the Last Year:   . Arboriculturist in the Last Year:   Transportation Needs:   . Film/video editor (Medical):   Marland Kitchen Lack of Transportation (  Non-Medical):   Physical Activity:   . Days of Exercise per Week:   . Minutes of Exercise per Session:   Stress:   . Feeling of Stress :   Social Connections:   . Frequency of Communication with Friends and Family:   . Frequency of Social Gatherings with Friends and Family:   . Attends Religious Services:   . Active Member of Clubs or Organizations:   . Attends Archivist Meetings:   Marland Kitchen Marital Status:   Intimate Partner Violence:   . Fear of Current or Ex-Partner:   . Emotionally Abused:   Marland Kitchen Physically Abused:   . Sexually Abused:    Family History  Problem Relation Age of Onset  . Colon cancer Neg Hx   . Colon polyps Neg Hx   . Esophageal cancer Neg Hx   . Rectal cancer Neg Hx   . Stomach cancer Neg Hx   . Breast cancer Neg Hx      OBJECTIVE:  Vitals:   08/11/19 1107  BP: (!) 156/98  Pulse: 92  Resp: 18  Temp: 98 F (36.7 C)  TempSrc: Oral  SpO2: 100%    General appearance: ALERT; in no acute distress.  Head: NCAT Lungs: Normal respiratory effort CV: radial and pedal pulses 2+ bilaterally. Cap refill < 2 seconds Musculoskeletal:  Inspection: Skin warm, dry, clear and intact without obvious erythema, effusion, or ecchymosis.  Palpation: Nontender to palpation ROM: FROM active and passive Skin: warm and dry Neurologic: Ambulates without difficulty; Sensation intact about the upper/ lower extremities Psychological: alert and cooperative; normal mood and affect  DIAGNOSTIC STUDIES:  No results found.   ASSESSMENT & PLAN:  1. Flank pain   2. Left sided sciatica      Muscular pain UA negative in office today No meds prescribed Discussed with patient that she should still be able to start exercising this week Discussed drinking enough water Continue conservative management of rest, ice, and gentle stretches Take ibuprofen as needed for pain relief (may cause abdominal discomfort, ulcers, and GI bleeds avoid taking with other NSAIDs) Follow up with PCP if symptoms persist Return or go to the ER if you have any new or worsening symptoms (fever, chills, chest pain, abdominal pain, changes in bowel or bladder habits, pain radiating into lower legs)   Reviewed expectations re: course of current medical issues. Questions answered. Outlined signs and symptoms indicating need for more acute intervention. Patient verbalized understanding. After Visit Summary given.       Faustino Congress, NP 08/11/19 1145

## 2019-08-11 NOTE — ED Triage Notes (Signed)
Pt presents with left flank pain that intermittently radiates around to LLQ X 2 weeks with no urinary symptoms.

## 2019-08-11 NOTE — Discharge Instructions (Signed)
Continue to see your chiropractor   Increase your water intake   If you are not feeling better over the next few days, follow up with this office or with primary care  Follow up with an orthopedist if your pain is not improving.  Go to the emergency department if you have worsening pain or develop new symptoms such as difficulty with urination, weakness, numbness, loss of control of your bladder or bowels, fever, chills or other concerns.

## 2019-08-25 ENCOUNTER — Encounter (HOSPITAL_COMMUNITY): Payer: Self-pay | Admitting: Emergency Medicine

## 2019-08-25 ENCOUNTER — Other Ambulatory Visit: Payer: Self-pay

## 2019-08-25 ENCOUNTER — Ambulatory Visit (HOSPITAL_COMMUNITY)
Admission: EM | Admit: 2019-08-25 | Discharge: 2019-08-25 | Disposition: A | Discharge status: Home / Self Care | Payer: Medicare Other | Attending: Family Medicine | Admitting: Family Medicine

## 2019-08-25 DIAGNOSIS — R109 Unspecified abdominal pain: Secondary | ICD-10-CM | POA: Diagnosis not present

## 2019-08-25 MED ORDER — ZIKS ARTHRITIS PAIN RELIEF 0.025-1-12 % EX CREA: 1.0000 | TOPICAL_CREAM | Freq: Three times a day (TID) | CUTANEOUS | 1 refills | Status: DC | PRN

## 2019-08-25 NOTE — ED Triage Notes (Signed)
Pt c/o left sided pain. She states it got better since the last visit but then flared up again. Pt states she was unable to sleep last night and she wanted to be checked again.

## 2019-08-25 NOTE — Discharge Instructions (Signed)
Use the cream 3 times a day as needed. Follow up as needed for continued or worsening symptoms

## 2019-08-27 NOTE — ED Provider Notes (Addendum)
[facility]    CSN: [id number] Arrival date & time: 08/25/19  1537      History   Chief Complaint Chief Complaint  Patient presents with  . Flank Pain    HPI April Tucker is a 79 y.o. female.   Patient is a 79 year old female that presents today for left flank pain.  This has been present, waxing waning over the past 4 weeks.  Was seen here previously and treated for musculoskeletal pain but reports the pain still persists.  Describes the pain as sharp, stabbing, burning intermittent.  Did not sleep much last night due to the pain. Reporting previous rash but none there currently. Has been using Aspercreme at times. No fever, chills.  No back pain, numbness, tingling, radiation of pain.  No dysuria, hematuria or urinary frequency.  ROS per HPI      Past Medical History:  Diagnosis Date  . Arthritis   . Cataract   . Heart rate fast    hx of this, not current  . Hypertension     Patient Active Problem List   Diagnosis Date Noted  . POLYP, COLON 01/06/2007  . HYPERTENSION 01/06/2007  . PALPITATIONS 01/06/2007    Past Surgical History:  Procedure Laterality Date  . ABDOMINAL HYSTERECTOMY    . COLONOSCOPY    . POLYPECTOMY    . TOE FUSION      OB History   No obstetric history on file.      Home Medications    Prior to Admission medications   Medication Sig Start Date End Date Taking? Authorizing Provider  aspirin 81 MG chewable tablet Chew 81 mg by mouth daily.   Yes [provider]  Cyanocobalamin (VITAMIN B 12 PO) Take 2 capsules by mouth daily.   Yes [provider]  famotidine (PEPCID) 20 MG tablet Take 1 tablet (20 mg total) by mouth 2 (two) times daily. 07/08/19  Yes Wieters, Hallie C, PA-C  HYDROcodone-acetaminophen (NORCO/VICODIN) 5-325 MG tablet Take 1-2 tablets by mouth every 6 (six) hours as needed for severe pain. 07/08/19  Yes Wieters, Hallie C, PA-C  lisinopril-hydrochlorothiazide (PRINZIDE,ZESTORETIC) 20-25 MG per  tablet Take 1 tablet by mouth daily.  12/30/12  Yes [provider]  meloxicam (MOBIC) 15 MG tablet Take 15 mg by mouth daily.  01/25/13  Yes [provider]  Multiple Vitamin (MULTIVITAMIN) tablet Take 1 tablet by mouth daily.   Yes [provider]  sucralfate (CARAFATE) 1 GM/10ML suspension Take 10 mLs (1 g total) by mouth 4 (four) times daily -  with meals and at bedtime. 07/08/19  Yes Wieters, Hallie C, PA-C  Capsaicin-Menthol-Methyl Sal (CAPSAICIN-METHYL SAL-MENTHOL) 0.025-1-12 % CREA Apply 1 Tube topically 3 (three) times daily as needed. 08/25/19   Loura Halt A, NP  omeprazole (PRILOSEC) 20 MG capsule Take 1 capsule (20 mg total) by mouth 2 (two) times daily before a meal for 14 days. 07/08/19 07/22/19  Wieters, Elesa Hacker, PA-C    Family History Family History  Problem Relation Age of Onset  . Colon cancer Neg Hx   . Colon polyps Neg Hx   . Esophageal cancer Neg Hx   . Rectal cancer Neg Hx   . Stomach cancer Neg Hx   . Breast cancer Neg Hx     Social History Social History   Tobacco Use  . Smoking status: Former Research scientist (life sciences)  . Smokeless tobacco: Never Used  Substance Use Topics  . Alcohol use: Yes    Alcohol/week: 0.0 standard  drinks    Comment: occ beer and wine   . Drug use: No     Allergies   Patient has no known allergies.   Review of Systems Review of Systems   Physical Exam Triage Vital Signs ED Triage Vitals  Enc Vitals Group     BP 08/25/19 1600 131/76     Pulse Rate 08/25/19 1600 (!) 103     Resp 08/25/19 1600 13     Temp 08/25/19 1600 98.7 F (37.1 C)     Temp Source 08/25/19 1600 Oral     SpO2 08/25/19 1600 98 %     Weight --      Height --      Head Circumference --      Peak Flow --      Pain Score 08/25/19 1557 5     Pain Loc --      Pain Edu? --      Excl. in [place]? --    No data found.  Updated Vital Signs BP 131/76 (BP Location: Right Arm)   Pulse (!) 103   Temp 98.7 F (37.1 C) (Oral)   Resp 13   SpO2 98%    Visual Acuity Right Eye Distance:   Left Eye Distance:   Bilateral Distance:    Right Eye Near:   Left Eye Near:    Bilateral Near:     Physical Exam Vitals and nursing note reviewed.  Constitutional:      General: She is not in acute distress.    Appearance: Normal appearance. She is not ill-appearing, toxic-appearing or diaphoretic.  HENT:     Head: Normocephalic.     Nose: Nose normal.     Mouth/Throat:     Pharynx: Oropharynx is clear.  Eyes:     Conjunctiva/sclera: Conjunctivae normal.  Pulmonary:     Effort: Pulmonary effort is normal.  Abdominal:     Palpations: Abdomen is soft.     Tenderness: There is no abdominal tenderness.  Musculoskeletal:        General: Normal range of motion.     Cervical back: Normal range of motion.  Skin:    General: Skin is warm and dry.     Findings: No rash.          Comments: Non tender to palpation of area where pt describes the pain.  No rash.    Neurological:     Mental Status: She is alert.  Psychiatric:        Mood and Affect: Mood normal.      UC Treatments / Results  Labs (all labs ordered are listed, but only abnormal results are displayed) Labs Reviewed - No data to display  EKG   Radiology No results found.  Procedures Procedures (including critical care time)  Medications Ordered in UC Medications - No data to display  Initial Impression / Assessment and Plan / UC Course  I have reviewed the triage vital signs and the nursing notes.  Pertinent labs & imaging results that were available during my care of the patient were reviewed by me and considered in my medical decision making (see chart for details).     Flank pain-  Doubt musculoskeletal.  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  No current rash today. We will try capsaicin cream Recommended follow-up with primary care as needed Final Clinical Impressions(s) / UC Diagnoses   Final diagnoses:  Flank pain     Discharge  Instructions  Use the cream 3 times a day as needed. Follow up as needed for continued or worsening symptoms     ED Prescriptions    Medication Sig Dispense Auth. Provider   Capsaicin-Menthol-Methyl Sal (CAPSAICIN-METHYL SAL-MENTHOL) 0.025-1-12 % CREA Apply 1 Tube topically 3 (three) times daily as needed. 56.6 g Loura Halt A, NP     PDMP not reviewed this encounter.   Orvan July, NP 08/27/19 0914    Orvan July, NP 08/27/19 [phone]

## 2019-11-19 ENCOUNTER — Ambulatory Visit: Payer: Medicare Other | Attending: Internal Medicine

## 2019-11-19 DIAGNOSIS — Z23 Encounter for immunization: Secondary | ICD-10-CM

## 2019-11-19 NOTE — Progress Notes (Signed)
   Covid-19 Vaccination Clinic  Name:  April Tucker    MRN: [id number] DOB: 09-Apr-1940  11/19/2019  [name] was observed post Covid-19 immunization for 15 minutes without incident. She was provided with Vaccine Information Sheet and instruction to access the V-Safe system.   [name] was instructed to call 911 with any severe reactions post vaccine: Marland Kitchen Difficulty breathing  . Swelling of face and throat  . A fast heartbeat  . A bad rash all over body  . Dizziness and weakness

## 2020-02-17 ENCOUNTER — Encounter (HOSPITAL_COMMUNITY): Payer: Self-pay

## 2020-02-17 ENCOUNTER — Other Ambulatory Visit: Payer: Self-pay | Admitting: Internal Medicine

## 2020-02-17 ENCOUNTER — Other Ambulatory Visit: Payer: Self-pay

## 2020-02-17 ENCOUNTER — Ambulatory Visit (HOSPITAL_COMMUNITY)
Admission: EM | Admit: 2020-02-17 | Discharge: 2020-02-17 | Disposition: A | Discharge status: Home / Self Care | Payer: Medicare Other | Attending: Emergency Medicine | Admitting: Emergency Medicine

## 2020-02-17 DIAGNOSIS — Z1231 Encounter for screening mammogram for malignant neoplasm of breast: Secondary | ICD-10-CM

## 2020-02-17 DIAGNOSIS — R350 Frequency of micturition: Secondary | ICD-10-CM

## 2020-02-17 DIAGNOSIS — N390 Urinary tract infection, site not specified: Secondary | ICD-10-CM | POA: Diagnosis not present

## 2020-02-17 LAB — POCT URINALYSIS DIPSTICK, ED / UC
Bilirubin Urine: NEGATIVE
Glucose, UA: NEGATIVE mg/dL
Ketones, ur: NEGATIVE mg/dL
Nitrite: POSITIVE — AB
Protein, ur: NEGATIVE mg/dL
Specific Gravity, Urine: 1.025 (ref 1.005–1.030)
Urobilinogen, UA: 0.2 mg/dL (ref 0.0–1.0)
pH: 5 (ref 5.0–8.0)

## 2020-02-17 MED ORDER — CEPHALEXIN 500 MG PO CAPS: 500.0000 mg | ORAL_CAPSULE | Freq: Two times a day (BID) | ORAL | 0 refills | Status: AC

## 2020-02-17 NOTE — ED Triage Notes (Signed)
Pt presents with increased urinary frequency and burning sensation when urinating x 2 weeks. Denies fever, chills, back pain.

## 2020-02-17 NOTE — ED Provider Notes (Signed)
April Tucker    CSN: [id number] Arrival date & time: 02/17/20  0802      History   Chief Complaint Chief Complaint  Patient presents with  . Urinary Frequency    HPI April Tucker is a 79 y.o. female.   April Tucker presents with complaints of dysuria and polyuria which started around 11/24. Feels similar to previous UTI's she has had in the past. No back or pelvic pain. No fevers. No blood to urine. No gi symptoms.    ROS per HPI, negative if not otherwise mentioned.      Past Medical History:  Diagnosis Date  . Arthritis   . Cataract   . Heart rate fast    hx of this, not current  . Hypertension     Patient Active Problem List   Diagnosis Date Noted  . POLYP, COLON 01/06/2007  . HYPERTENSION 01/06/2007  . PALPITATIONS 01/06/2007    Past Surgical History:  Procedure Laterality Date  . ABDOMINAL HYSTERECTOMY    . COLONOSCOPY    . POLYPECTOMY    . TOE FUSION      OB History   No obstetric history on file.      Home Medications    Prior to Admission medications   Medication Sig Start Date End Date Taking? Authorizing Provider  aspirin 81 MG chewable tablet Chew 81 mg by mouth daily.    [provider]  Capsaicin-Menthol-Methyl Sal (CAPSAICIN-METHYL SAL-MENTHOL) 0.025-1-12 % CREA Apply 1 Tube topically 3 (three) times daily as needed. 08/25/19   Loura Halt A, NP  cephALEXin (KEFLEX) 500 MG capsule Take 1 capsule (500 mg total) by mouth 2 (two) times daily for 7 days. 02/17/20 02/24/20  Zigmund Gottron, NP  Cyanocobalamin (VITAMIN B 12 PO) Take 2 capsules by mouth daily.    [provider]  famotidine (PEPCID) 20 MG tablet Take 1 tablet (20 mg total) by mouth 2 (two) times daily. 07/08/19   Wieters, Hallie C, PA-C  HYDROcodone-acetaminophen (NORCO/VICODIN) 5-325 MG tablet Take 1-2 tablets by mouth every 6 (six) hours as needed for severe pain. 07/08/19   Wieters, Hallie C, PA-C  lisinopril-hydrochlorothiazide  (PRINZIDE,ZESTORETIC) 20-25 MG per tablet Take 1 tablet by mouth daily.  12/30/12   [provider]  meloxicam (MOBIC) 15 MG tablet Take 15 mg by mouth daily.  01/25/13   [provider]  Multiple Vitamin (MULTIVITAMIN) tablet Take 1 tablet by mouth daily.    [provider]  omeprazole (PRILOSEC) 20 MG capsule Take 1 capsule (20 mg total) by mouth 2 (two) times daily before a meal for 14 days. 07/08/19 07/22/19  Wieters, Hallie C, PA-C  sucralfate (CARAFATE) 1 GM/10ML suspension Take 10 mLs (1 g total) by mouth 4 (four) times daily -  with meals and at bedtime. 07/08/19   Wieters, Elesa Hacker, PA-C    Family History Family History  Problem Relation Age of Onset  . Colon cancer Neg Hx   . Colon polyps Neg Hx   . Esophageal cancer Neg Hx   . Rectal cancer Neg Hx   . Stomach cancer Neg Hx   . Breast cancer Neg Hx     Social History Social History   Tobacco Use  . Smoking status: Former Research scientist (life sciences)  . Smokeless tobacco: Never Used  Substance Use Topics  . Alcohol use: Yes    Alcohol/week: 0.0 standard drinks    Comment: occ beer and wine   . Drug use: No  Allergies   Patient has no known allergies.   Review of Systems Review of Systems   Physical Exam Triage Vital Signs ED Triage Vitals  Enc Vitals Group     BP 02/17/20 0837 127/65     Pulse Rate 02/17/20 0837 80     Resp 02/17/20 0837 18     Temp 02/17/20 0837 98.1 F (36.7 C)     Temp Source 02/17/20 0837 Oral     SpO2 02/17/20 0837 100 %     Weight --      Height --      Head Circumference --      Peak Flow --      Pain Score 02/17/20 0836 0     Pain Loc --      Pain Edu? --      Excl. in [place]? --    No data found.  Updated Vital Signs BP 127/65 (BP Location: Right Arm)   Pulse 80   Temp 98.1 F (36.7 C) (Oral)   Resp 18   SpO2 100%   Visual Acuity Right Eye Distance:   Left Eye Distance:   Bilateral Distance:    Right Eye Near:   Left Eye Near:    Bilateral Near:      Physical Exam Constitutional:      General: She is not in acute distress.    Appearance: She is well-developed.  Cardiovascular:     Rate and Rhythm: Normal rate.  Pulmonary:     Effort: Pulmonary effort is normal.  Abdominal:     Tenderness: There is no abdominal tenderness. There is no right CVA tenderness or left CVA tenderness.  Skin:    General: Skin is warm and dry.  Neurological:     Mental Status: She is alert and oriented to person, place, and time.      UC Treatments / Results  Labs (all labs ordered are listed, but only abnormal results are displayed) Labs Reviewed  POCT URINALYSIS DIPSTICK, ED / UC - Abnormal; Notable for the following components:      Result Value   Hgb urine dipstick MODERATE (*)    Nitrite POSITIVE (*)    Leukocytes,Ua MODERATE (*)    All other components within normal limits  URINE CULTURE    EKG   Radiology No results found.  Procedures Procedures (including critical care time)  Medications Ordered in UC Medications - No data to display  Initial Impression / Assessment and Plan / UC Course  I have reviewed the triage vital signs and the nursing notes.  Pertinent labs & imaging results that were available during my care of the patient were reviewed by me and considered in my medical decision making (see chart for details).     Urine with nitrite and leukocytes, consistent with UTI. Keflex initiated pending urine culture for confirmation. Return precautions provided. Patient verbalized understanding and agreeable to plan.   Final Clinical Impressions(s) / UC Diagnoses   Final diagnoses:  Lower urinary tract infectious disease     Discharge Instructions     Complete course of antibiotics.  Drink plenty of water to empty bladder regularly. Avoid alcohol and caffeine as these may irritate the bladder.   If symptoms worsen or do not improve in the next week to return to be seen or to follow up with your PCP.     ED  Prescriptions    Medication Sig Dispense Auth. Provider   cephALEXin (KEFLEX) 500 MG capsule Take 1  capsule (500 mg total) by mouth 2 (two) times daily for 7 days. 14 capsule Zigmund Gottron, NP     PDMP not reviewed this encounter.   Zigmund Gottron, NP 02/17/20 [phone]

## 2020-02-17 NOTE — Discharge Instructions (Signed)
Complete course of antibiotics.  Drink plenty of water to empty bladder regularly. Avoid alcohol and caffeine as these may irritate the bladder.   If symptoms worsen or do not improve in the next week to return to be seen or to follow up with your PCP.

## 2020-02-18 LAB — URINE CULTURE: Culture: >=100000 — AB

## 2020-02-19 LAB — URINE CULTURE

## 2020-03-31 ENCOUNTER — Ambulatory Visit: Payer: Medicare Other

## 2020-04-10 ENCOUNTER — Other Ambulatory Visit: Payer: Self-pay

## 2020-04-10 ENCOUNTER — Ambulatory Visit
Admission: RE | Admit: 2020-04-10 | Discharge: 2020-04-10 | Disposition: A | Discharge status: Home / Self Care | Payer: Medicare Other | Source: Ambulatory Visit | Attending: Internal Medicine | Admitting: Internal Medicine

## 2020-04-10 DIAGNOSIS — Z1231 Encounter for screening mammogram for malignant neoplasm of breast: Secondary | ICD-10-CM

## 2020-06-03 ENCOUNTER — Other Ambulatory Visit: Payer: Self-pay

## 2020-06-03 ENCOUNTER — Encounter (HOSPITAL_COMMUNITY): Payer: Self-pay

## 2020-06-03 ENCOUNTER — Ambulatory Visit (HOSPITAL_COMMUNITY)
Admission: EM | Admit: 2020-06-03 | Discharge: 2020-06-03 | Disposition: A | Discharge status: Home / Self Care | Payer: Medicare Other | Attending: Family Medicine | Admitting: Family Medicine

## 2020-06-03 DIAGNOSIS — S161XXA Strain of muscle, fascia and tendon at neck level, initial encounter: Secondary | ICD-10-CM | POA: Insufficient documentation

## 2020-06-03 DIAGNOSIS — Z87891 Personal history of nicotine dependence: Secondary | ICD-10-CM | POA: Insufficient documentation

## 2020-06-03 DIAGNOSIS — X58XXXA Exposure to other specified factors, initial encounter: Secondary | ICD-10-CM | POA: Diagnosis not present

## 2020-06-03 DIAGNOSIS — R42 Dizziness and giddiness: Secondary | ICD-10-CM | POA: Diagnosis present

## 2020-06-03 LAB — CBC
HCT: 38.5 % (ref 36.0–46.0)
Hemoglobin: 12.3 g/dL (ref 12.0–15.0)
MCH: 28.1 pg (ref 26.0–34.0)
MCHC: 31.9 g/dL (ref 30.0–36.0)
MCV: 88.1 fL (ref 80.0–100.0)
Platelets: 198 10*3/uL (ref 150–400)
RBC: 4.37 MIL/uL (ref 3.87–5.11)
RDW: 13.7 % (ref 11.5–15.5)
WBC: 5.3 10*3/uL (ref 4.0–10.5)
nRBC: 0 % (ref 0.0–0.2)

## 2020-06-03 LAB — BASIC METABOLIC PANEL
Anion gap: 6 (ref 5–15)
BUN: 17 mg/dL (ref 8–23)
CO2: 28 mmol/L (ref 22–32)
Calcium: 9.7 mg/dL (ref 8.9–10.3)
Chloride: 103 mmol/L (ref 98–111)
Creatinine, Ser: 1.29 mg/dL — ABNORMAL HIGH (ref 0.44–1.00)
GFR, Estimated: 42 mL/min — ABNORMAL LOW (ref >60)
Glucose, Bld: 95 mg/dL (ref 70–99)
Potassium: 4.4 mmol/L (ref 3.5–5.1)
Sodium: 137 mmol/L (ref 135–145)

## 2020-06-03 LAB — POCT URINALYSIS DIPSTICK, ED / UC
Bilirubin Urine: NEGATIVE
Glucose, UA: NEGATIVE mg/dL
Ketones, ur: NEGATIVE mg/dL
Leukocytes,Ua: NEGATIVE
Nitrite: NEGATIVE
Protein, ur: NEGATIVE mg/dL
Specific Gravity, Urine: 1.01 (ref 1.005–1.030)
Urobilinogen, UA: 0.2 mg/dL (ref 0.0–1.0)
pH: 5.5 (ref 5.0–8.0)

## 2020-06-03 LAB — TSH: TSH: 3.726 u[IU]/mL (ref 0.350–4.500)

## 2020-06-03 NOTE — ED Triage Notes (Signed)
Pt reportsdizziness at night when laying down, neck pain, left shoulder pain x 3 weeks; worsens when trying to stand up and hold the bar bathroom. Pt has not taken any OTC meds for complaints. Denies chest pain, vision changes, nausea, abdominal pain.

## 2020-06-03 NOTE — ED Provider Notes (Signed)
April Tucker   [id number] 06/03/20 Arrival Time: 5784  ASSESSMENT & PLAN:  1. Strain of neck muscle, initial encounter   2. Intermittent lightheadedness    ECG: NSR. No STEMI.   Discharge Instructions     You may try using over the counter Voltaren gel as directed on the package. Keep your follow up appointment with your primary doctor.  Your ECG (heart tracing) was normal.  You have had labs (blood work) drawn today. We will call you with any significant abnormalities or if there is need to begin or change treatment or pursue further follow up.  You may also review your test results online through [facility]. If you do not have a MyChart account, instructions to sign up should be on your discharge paperwork.    Reviewed expectations re: course of current medical issues. Questions answered. Outlined signs and symptoms indicating need for more acute intervention. Patient verbalized understanding. After Visit Summary given.  SUBJECTIVE: History from: patient. April Tucker is a 80 y.o. female who reports fairly persistent L sided neck pain extending into upper back; gradual onset; first noted 3 w ago; no injury/trauma. Pain exacerbated with extension of LUE. No extremity sensation changes or weakness. Tylenol helps some. Also reports intermittent lightheadedness; randomly feels; may last several minutes. Ambulatory without difficulty. No headaches or visual changes. Denies CP/SOB. No new medications.  Past Surgical History:  Procedure Laterality Date  . ABDOMINAL HYSTERECTOMY    . COLONOSCOPY    . POLYPECTOMY    . TOE FUSION        OBJECTIVE:  Vitals:   06/03/20 1834  BP: 115/69  Pulse: 95  Resp: 18  Temp: 98.2 F (36.8 C)  TempSrc: Oral  SpO2: 100%    General appearance: alert; no distress HEENT: [place]; AT Neck: supple with FROM; tender over L cervical musculature extending over L trapezius; feels tight Resp: unlabored respirations Extremities: moves  all extremities normally with FROM CV: brisk extremity capillary refill of LUE; 2+ radial pulse of LUE. Skin: warm and dry; no visible rashes Neurologic: gait normal; normal sensation and strength of LUE Psychological: alert and cooperative; normal mood and affect   No Known Allergies  Past Medical History:  Diagnosis Date  . Arthritis   . Cataract   . Heart rate fast    hx of this, not current  . Hypertension    Social History   Socioeconomic History  . Marital status: Single    Spouse name: Not on file  . Number of children: Not on file  . Years of education: Not on file  . Highest education level: Not on file  Occupational History  . Not on file  Tobacco Use  . Smoking status: Former Research scientist (life sciences)  . Smokeless tobacco: Never Used  Substance and Sexual Activity  . Alcohol use: Yes    Alcohol/week: 0.0 standard drinks    Comment: occ beer and wine   . Drug use: No  . Sexual activity: Not on file  Other Topics Concern  . Not on file  Social History Narrative  . Not on file   Social Determinants of Health   Financial Resource Strain: Not on file  Food Insecurity: Not on file  Transportation Needs: Not on file  Physical Activity: Not on file  Stress: Not on file  Social Connections: Not on file   Family History  Problem Relation Age of Onset  . Colon cancer Neg Hx   . Colon polyps Neg Hx   .  Esophageal cancer Neg Hx   . Rectal cancer Neg Hx   . Stomach cancer Neg Hx   . Breast cancer Neg Hx    Past Surgical History:  Procedure Laterality Date  . ABDOMINAL HYSTERECTOMY    . COLONOSCOPY    . POLYPECTOMY    . TOE Luetta Nutting, MD 06/03/20 [phone]

## 2020-06-03 NOTE — Discharge Instructions (Signed)
You may try using over the counter Voltaren gel as directed on the package. Keep your follow up appointment with your primary doctor.  Your ECG (heart tracing) was normal.  You have had labs (blood work) drawn today. We will call you with any significant abnormalities or if there is need to begin or change treatment or pursue further follow up.  You may also review your test results online through [facility]. If you do not have a MyChart account, instructions to sign up should be on your discharge paperwork.

## 2020-08-24 ENCOUNTER — Encounter (HOSPITAL_COMMUNITY): Payer: Self-pay

## 2020-08-24 ENCOUNTER — Ambulatory Visit (INDEPENDENT_AMBULATORY_CARE_PROVIDER_SITE_OTHER): Payer: Medicare Other

## 2020-08-24 ENCOUNTER — Other Ambulatory Visit: Payer: Self-pay

## 2020-08-24 ENCOUNTER — Ambulatory Visit (HOSPITAL_COMMUNITY)
Admission: EM | Admit: 2020-08-24 | Discharge: 2020-08-24 | Disposition: A | Discharge status: Home / Self Care | Payer: Medicare Other | Attending: Emergency Medicine | Admitting: Emergency Medicine

## 2020-08-24 DIAGNOSIS — M25512 Pain in left shoulder: Secondary | ICD-10-CM

## 2020-08-24 DIAGNOSIS — J189 Pneumonia, unspecified organism: Secondary | ICD-10-CM | POA: Diagnosis not present

## 2020-08-24 DIAGNOSIS — R059 Cough, unspecified: Secondary | ICD-10-CM | POA: Diagnosis not present

## 2020-08-24 MED ORDER — PREDNISONE 10 MG PO TABS: 10.0000 mg | ORAL_TABLET | ORAL | 0 refills | Status: AC

## 2020-08-24 MED ORDER — AZITHROMYCIN 250 MG PO TABS: ORAL_TABLET | ORAL | 0 refills | Status: AC

## 2020-08-24 NOTE — ED Provider Notes (Signed)
[facility]  ____________________________________________  Time seen: Approximately 4:08 PM  I have reviewed the triage vital signs and the nursing notes.   HISTORY  Chief Complaint Cough, Nasal Congestion, and Clavicle Injury (Pain, no injury )    HPI April Tucker is a 80 y.o. femalewho presents to the urgent care complaining of 2 complaints.  Patient's primary complaint is ongoing cough and nasal congestion.  Patient states that she has had symptoms x3 weeks.  Started off with fevers, chills, nasal congestions, sore throat and cough.  Majority of symptoms have improved but she still has some nasal congestion and cough.  No chest pain or shortness of breath.  Patient denies any ongoing fevers or chills.  Patient is stating that she is primarily aggravated how long symptoms have lasted.  She is also complaining of some sternoclavicular joint pain.  She states that that has been off and on for months.  She seen multiple providers including urgent care and primary care for this complaint.  She has used Voltaren gel which has not fully alleviated symptoms.  She states that she will have pain with range of motion.  There is no increased pain with inspiration.  She denies this being substernal and states that she can palpate the area over the joint space itself.  No trauma to the area.       Past Medical History:  Diagnosis Date   Arthritis    Cataract    Heart rate fast    hx of this, not current   Hypertension     Patient Active Problem List   Diagnosis Date Noted   POLYP, COLON 01/06/2007   HYPERTENSION 01/06/2007   PALPITATIONS 01/06/2007    Past Surgical History:  Procedure Laterality Date   ABDOMINAL HYSTERECTOMY     COLONOSCOPY     POLYPECTOMY     TOE FUSION      Prior to Admission medications   Medication Sig Start Date End Date Taking? Authorizing Provider  aspirin 81 MG chewable tablet Chew 81 mg by mouth daily.   Yes [provider]   azithromycin (ZITHROMAX Z-PAK) 250 MG tablet Take 2 tablets (500 mg) on  Day 1,  followed by 1 tablet (250 mg) once daily on Days 2 through 5. 08/24/20  Yes Dayquan Buys, Charline Bills, PA-C  Cyanocobalamin (VITAMIN B 12 PO) Take 2 capsules by mouth daily.   Yes [provider]  lisinopril-hydrochlorothiazide (ZESTORETIC) 20-25 MG tablet Take 1 tablet by mouth daily.   Yes [provider]  omeprazole (PRILOSEC) 20 MG capsule Take 1 capsule (20 mg total) by mouth 2 (two) times daily before a meal for 14 days. 07/08/19 08/24/20 Yes Wieters, Hallie C, PA-C  predniSONE (DELTASONE) 10 MG tablet Take 1 tablet (10 mg total) by mouth as directed. 08/24/20  Yes Charnetta Wulff, Charline Bills, PA-C  sucralfate (CARAFATE) 1 GM/10ML suspension Take 10 mLs (1 g total) by mouth 4 (four) times daily -  with meals and at bedtime. 07/08/19  Yes Wieters, Hallie C, PA-C  famotidine (PEPCID) 20 MG tablet Take 1 tablet (20 mg total) by mouth 2 (two) times daily. 07/08/19 06/03/20  Wieters, Hallie C, PA-C    Allergies Patient has no known allergies.  Family History  Problem Relation Age of Onset   Colon cancer Neg Hx    Colon polyps Neg Hx    Esophageal cancer Neg Hx    Rectal cancer Neg Hx    Stomach cancer Neg Hx  Breast cancer Neg Hx     Social History Social History   Tobacco Use   Smoking status: Former    Pack years: 0.00   Smokeless tobacco: Never  Substance Use Topics   Alcohol use: Yes    Alcohol/week: 0.0 standard drinks    Comment: occ beer and wine    Drug use: No     Review of Systems  Constitutional: No fever/chills Eyes: No visual changes. No discharge ENT: Nasal congestion Cardiovascular: no chest pain. Respiratory: 3-week history of nonproductive cough. No SOB. Gastrointestinal: No abdominal pain.  No nausea, no vomiting.  No diarrhea.  No constipation. Musculoskeletal: Sternoclavicular joint pain Skin: Negative for rash, abrasions, lacerations, ecchymosis. Neurological:  Negative for headaches, focal weakness or numbness.  10 System ROS otherwise negative.  ____________________________________________   PHYSICAL EXAM:  VITAL SIGNS: ED Triage Vitals  Enc Vitals Group     BP 08/24/20 1533 112/85     Pulse Rate 08/24/20 1533 93     Resp 08/24/20 1533 18     Temp 08/24/20 1533 98.3 F (36.8 C)     Temp Source 08/24/20 1533 Oral     SpO2 08/24/20 1533 100 %     Weight --      Height --      Head Circumference --      Peak Flow --      Pain Score 08/24/20 1529 3     Pain Loc --      Pain Edu? --      Excl. in [place]? --      Constitutional: Alert and oriented. Well appearing and in no acute distress. Eyes: Conjunctivae are normal. PERRL. EOMI. Head: Atraumatic. ENT:      Ears:       Nose: No congestion/rhinnorhea.      Mouth/Throat: Mucous membranes are moist.  Neck: No stridor.  No cervical spine tenderness to palpation  Cardiovascular: Normal rate, regular rhythm. Normal S1 and S2.  Good peripheral circulation. Respiratory: Normal respiratory effort without tachypnea or retractions. Lungs CTAB. Good air entry to the bases with no decreased or absent breath sounds. Musculoskeletal: Full range of motion to all extremities. No gross deformities appreciated.  Visualization of the anterior chest reveals some mild edema over the sternoclavicular joint space.  No other visible abnormality.  Patient is tender in this region directly over the joint itself.  There is no other tenderness along the sternum, clavicle or ribs. Neurologic:  Normal speech and language. No gross focal neurologic deficits are appreciated.  Skin:  Skin is warm, dry and intact. No rash noted. Psychiatric: Mood and affect are normal. Speech and behavior are normal. Patient exhibits appropriate insight and judgement.   ____________________________________________   LABS (all labs ordered are listed, but only abnormal results are displayed)  Labs Reviewed - No data to  display ____________________________________________  EKG   ____________________________________________  RADIOLOGY I personally viewed and evaluated these images as part of my medical decision making, as well as reviewing the written report by the radiologist.  ED Provider Interpretation: No consolidation concerning for pneumonia.  No musculoskeletal abnormality on imaging  DG Chest 2 View  Result Date: 08/24/2020 CLINICAL DATA:  80 year old female with cough. EXAM: CHEST - 2 VIEW COMPARISON:  Chest radiograph dated 04/24/2006. FINDINGS: The lungs are clear. There is no pleural effusion pneumothorax. The cardiac silhouette is within limits. Atherosclerotic calcification of the aorta. Osteopenia with degenerative changes of the spine and shoulders. No acute osseous pathology. IMPRESSION:  No active cardiopulmonary disease. Electronically Signed   By: Anner Crete M.D.   On: 08/24/2020 16:28    ____________________________________________    PROCEDURES  Procedure(s) performed:    Procedures    Medications - No data to display   ____________________________________________   INITIAL IMPRESSION / ASSESSMENT AND PLAN / ED COURSE  Pertinent labs & imaging results that were available during my care of the patient were reviewed by me and considered in my medical decision making (see chart for details).  Review of the [place] CSRS was performed in accordance of the [place] prior to dispensing any controlled drugs.           Patient's diagnosis is consistent with [facility] pneumonia, sternoclavicular joint pain.  Patient presented to the urgent care complaining of 3 weeks of cough as well as ongoing sternoclavicular joint pain.  Joint pain has been ongoing for multiple months, she has used Voltaren gel with minimal relief.  No direct trauma to the area.  Imaging and physical exam is reassuring.  Suspect component of costochondral involvement especially with recent coughing.   Patient had no evidence of consolidation concerning for lobar pneumonia but given 3-week history and patient's age I felt that treatment with azithromycin was warranted.  Return precautions discussed with the patient at length.  No further work-up at this time..  Follow-up with primary care this time.. Patient is given ED precautions to return to the ED for any worsening or new symptoms.     ____________________________________________  FINAL CLINICAL IMPRESSION(S) / DIAGNOSES  Final diagnoses:  Community acquired pneumonia, unspecified laterality  Sternoclavicular joint pain, left      NEW MEDICATIONS STARTED DURING THIS VISIT:  ED Discharge Orders          Ordered    predniSONE (DELTASONE) 10 MG tablet  As directed       Note to Pharmacy: Take on a pattern of 6, 6, 5, 5, 4, 4, 3, 3, 2, 2, 1, 1   08/24/20 1718    azithromycin (ZITHROMAX Z-PAK) 250 MG tablet        08/24/20 1718                This chart was dictated using voice recognition software/Dragon. Despite best efforts to proofread, errors can occur which can change the meaning. Any change was purely unintentional.    Darletta Moll, PA-C 08/24/20 1731

## 2020-08-24 NOTE — ED Triage Notes (Signed)
Pt presents with cold symptoms x 2 weeks.  Reports cough, nasal congestion, sinus pressure.  Taking Mucinex at home.  Also reports constant L clavicle pain that started at same time as cold symptoms.  No injury. Denies SOB.  States she does get dizzy when she first stands up in morning.  States she just feels funny.

## 2021-03-17 ENCOUNTER — Other Ambulatory Visit: Payer: Self-pay

## 2021-03-17 ENCOUNTER — Ambulatory Visit: Payer: Medicare Other | Admitting: Podiatry

## 2021-03-17 ENCOUNTER — Encounter: Payer: Self-pay | Admitting: Podiatry

## 2021-03-17 DIAGNOSIS — B351 Tinea unguium: Secondary | ICD-10-CM

## 2021-03-17 DIAGNOSIS — M79675 Pain in left toe(s): Secondary | ICD-10-CM | POA: Diagnosis not present

## 2021-03-17 DIAGNOSIS — M79674 Pain in right toe(s): Secondary | ICD-10-CM

## 2021-03-18 NOTE — Progress Notes (Signed)
Subjective:   Patient ID: April Tucker, female   DOB: 81 y.o.   MRN: [id number]   HPI Patient presents with caregiver discussing chronic nail disease with thickness of beds 2 through 5 of both feet with the second and third nails right being very thickened dystrophic and painful when pressed.  Patient states they are hard for her to cut and her caregiver also cannot cut these appropriately.  Patient does not smoke and likes to be active   Review of Systems  All other systems reviewed and are negative.      Objective:  Physical Exam Vitals and nursing note reviewed.  Constitutional:      Appearance: She is well-developed.  Pulmonary:     Effort: Pulmonary effort is normal.  Musculoskeletal:        General: Normal range of motion.  Skin:    General: Skin is warm.  Neurological:     Mental Status: She is alert.    Neurovascular status found to be intact muscle strength is found to be adequate range of motion within normal limits.  Patient does have thickness of her bed history of having the big toenails removed in the past with the second and third right being very thickened dystrophic and all of them being painful.  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  We will see how she does with debridement and decide whether or not that will be appropriate for her

## 2021-03-22 ENCOUNTER — Other Ambulatory Visit: Payer: Self-pay | Admitting: Internal Medicine

## 2021-03-22 DIAGNOSIS — Z1231 Encounter for screening mammogram for malignant neoplasm of breast: Secondary | ICD-10-CM

## 2021-04-13 ENCOUNTER — Ambulatory Visit
Admission: RE | Admit: 2021-04-13 | Discharge: 2021-04-13 | Disposition: A | Discharge status: Home / Self Care | Payer: Medicare Other | Source: Ambulatory Visit | Attending: Internal Medicine | Admitting: Internal Medicine

## 2021-04-13 DIAGNOSIS — Z1231 Encounter for screening mammogram for malignant neoplasm of breast: Secondary | ICD-10-CM

## 2021-05-12 DEATH — deceased

## 2022-10-18 IMAGING — MG MM DIGITAL SCREENING BILAT W/ TOMO AND CAD
8 series · 8 of 24 positions shown · non-contrast
Comparison: Previous exam(s).

CLINICAL DATA: Screening.

EXAM:
DIGITAL SCREENING BILATERAL MAMMOGRAM WITH TOMOSYNTHESIS AND CAD
TECHNIQUE: Bilateral screening digital craniocaudal and mediolateral oblique
mammograms were obtained. Bilateral screening digital breast
tomosynthesis was performed. The images were evaluated with
computer-aided detection.

[L MLO synth-2D]
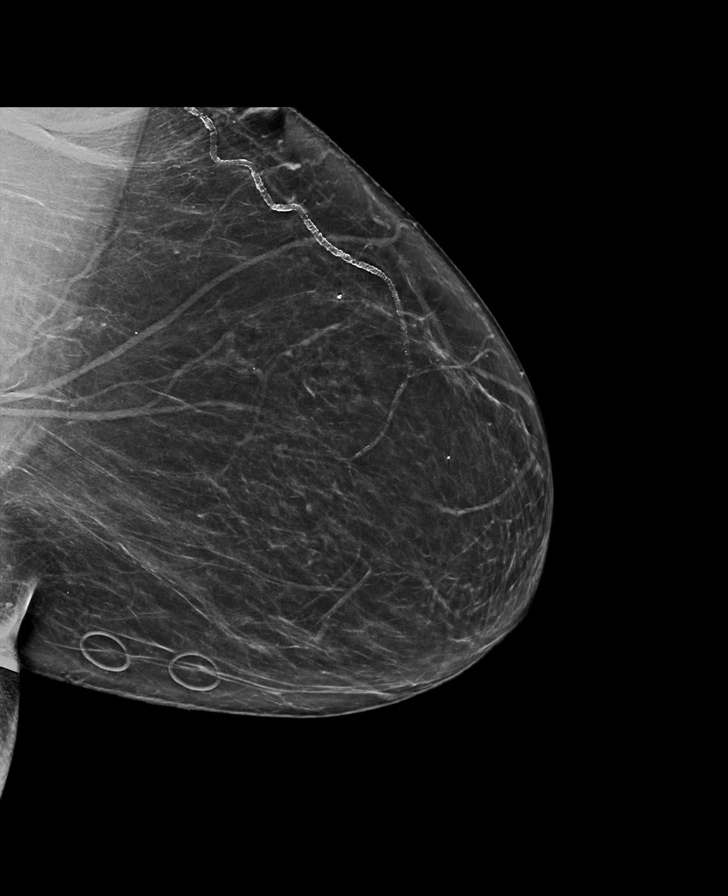

[R MLO synth-2D]
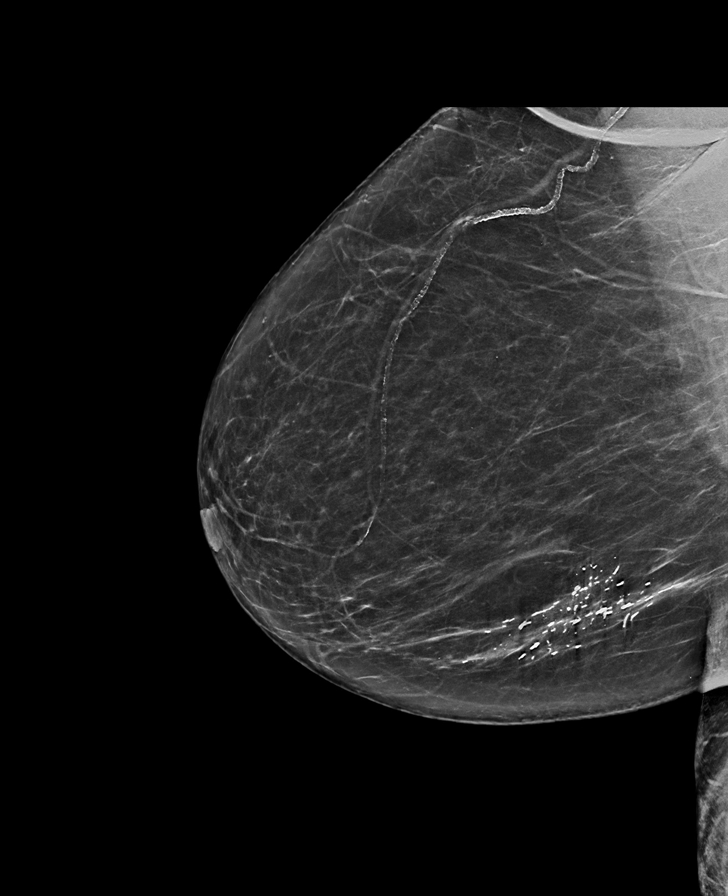

[R CC synth-2D]
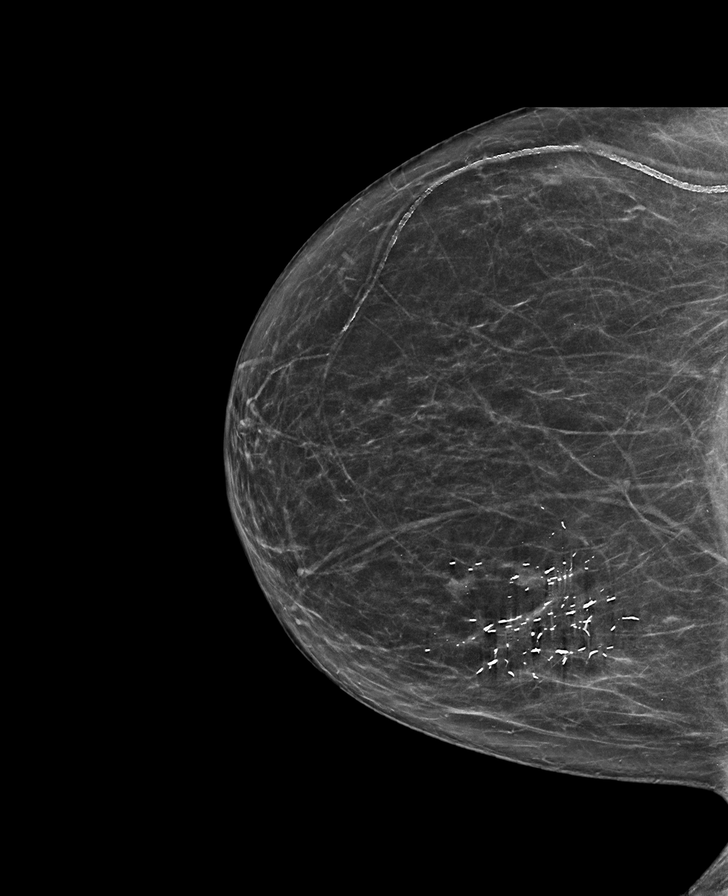

[L CC synth-2D]
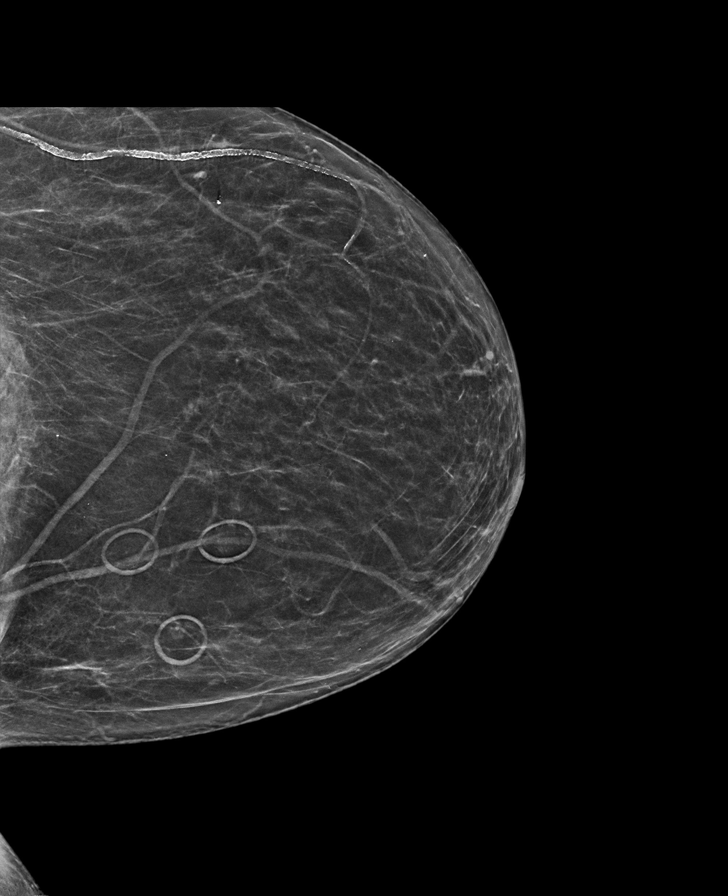

[L CC tomo · tomo slice 31/61.0]
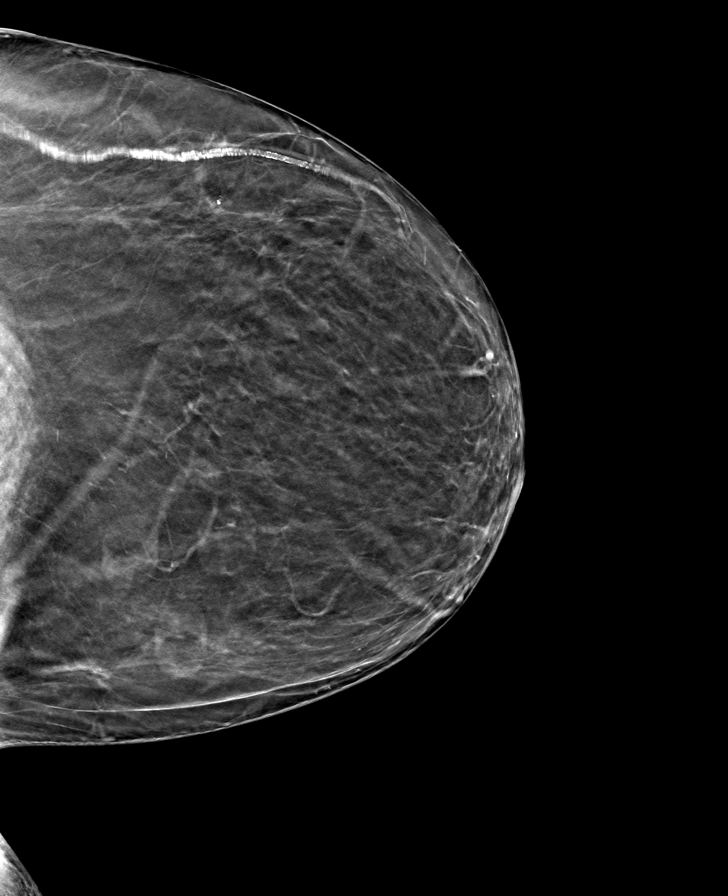

[R MLO tomo · tomo slice 37/74.0]
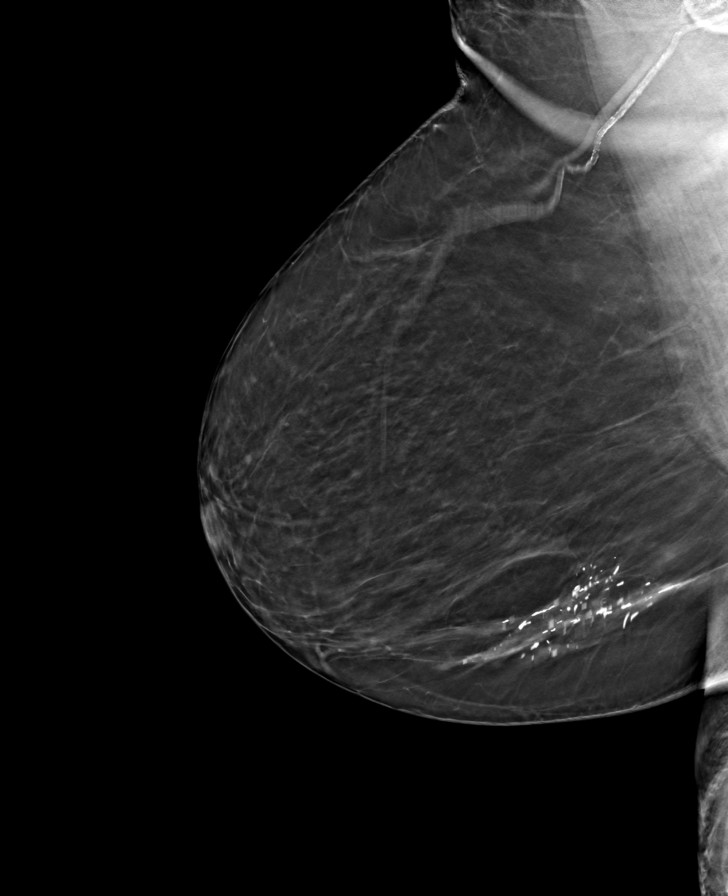

[L MLO tomo · tomo slice 38/75.0]
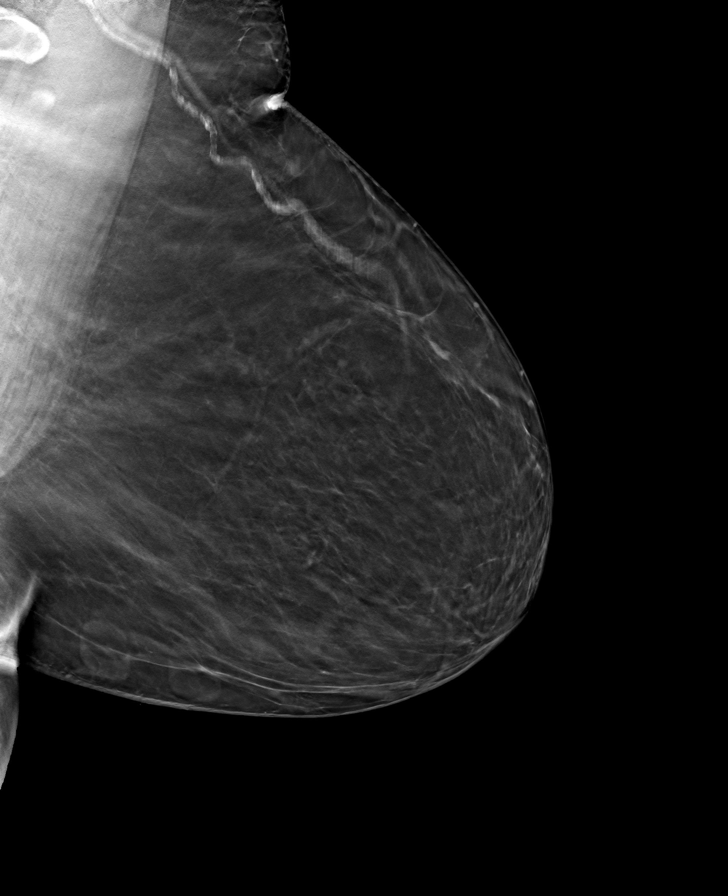

[R CC tomo · tomo slice 33/65.0]
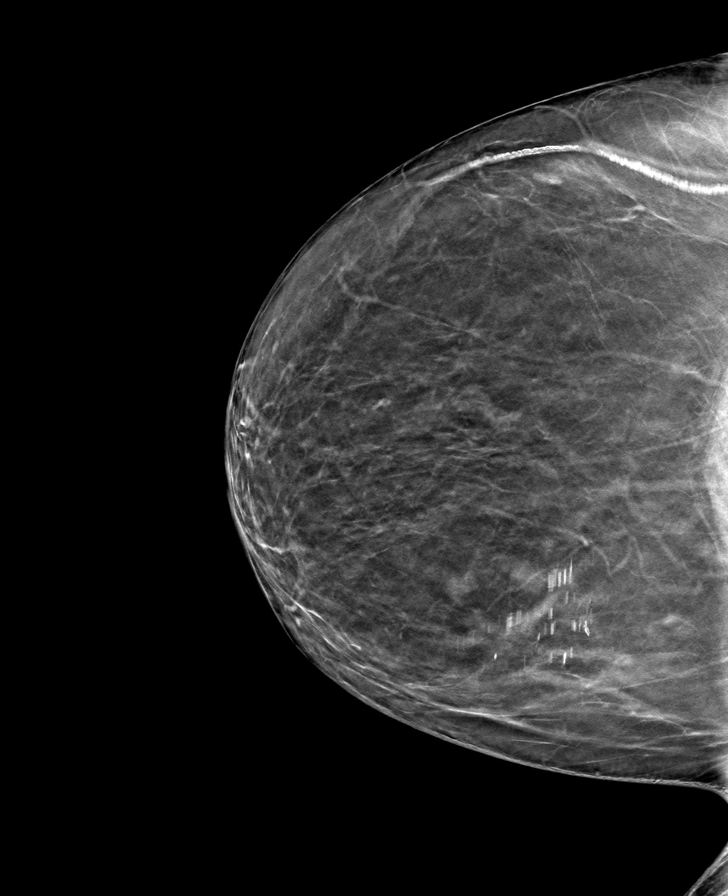

[8 of 24 positions shown; findings below may reference images not displayed]

ACR Breast Density Category b: There are scattered areas of
fibroglandular density.
FINDINGS: There are no findings suspicious for malignancy.
IMPRESSION: No mammographic evidence of malignancy. A result letter of this
screening mammogram will be mailed directly to the patient.

RECOMMENDATION:
Screening mammogram in one year. (Code:51-O-LD2)

BI-RADS CATEGORY  1: Negative.
